# Patient Record
Sex: Female | Born: 1935
Health system: Southern US, Community
[De-identification: ages and names within clinical notes are randomized; demographics above are authoritative.]

## PROBLEM LIST (undated history)

## (undated) DIAGNOSIS — T4145XA Adverse effect of unspecified anesthetic, initial encounter: Secondary | ICD-10-CM

## (undated) DIAGNOSIS — I10 Essential (primary) hypertension: Secondary | ICD-10-CM

## (undated) DIAGNOSIS — K219 Gastro-esophageal reflux disease without esophagitis: Secondary | ICD-10-CM

## (undated) DIAGNOSIS — M1990 Unspecified osteoarthritis, unspecified site: Secondary | ICD-10-CM

## (undated) DIAGNOSIS — E079 Disorder of thyroid, unspecified: Secondary | ICD-10-CM

## (undated) HISTORY — PX: CARPECTOMY: SHX5004

## (undated) HISTORY — DX: Disorder of thyroid, unspecified: E07.9

## (undated) HISTORY — DX: Gastro-esophageal reflux disease without esophagitis: K21.9

---

## 1966-09-05 HISTORY — PX: APPENDECTOMY: SHX54

## 1979-05-07 HISTORY — PX: ABDOMINAL HYSTERECTOMY: SHX81

## 1980-09-05 HISTORY — PX: OTHER SURGICAL HISTORY: SHX169

## 1980-09-05 HISTORY — PX: BILROTH I PROCEDURE: SHX1231

## 1988-09-05 HISTORY — PX: HYSTEROTOMY: SHX1776

## 2006-08-09 ENCOUNTER — Encounter: Admission: RE | Admit: 2006-08-09 | Discharge: 2006-08-09 | Payer: Self-pay | Admitting: Internal Medicine

## 2007-11-01 ENCOUNTER — Ambulatory Visit (HOSPITAL_COMMUNITY): Admission: RE | Admit: 2007-11-01 | Discharge: 2007-11-01 | Payer: Self-pay | Admitting: *Deleted

## 2007-11-01 ENCOUNTER — Encounter (INDEPENDENT_AMBULATORY_CARE_PROVIDER_SITE_OTHER): Payer: Self-pay | Admitting: *Deleted

## 2008-01-10 ENCOUNTER — Ambulatory Visit (HOSPITAL_COMMUNITY): Admission: RE | Admit: 2008-01-10 | Discharge: 2008-01-10 | Payer: Self-pay | Admitting: *Deleted

## 2008-09-05 DIAGNOSIS — T8859XA Other complications of anesthesia, initial encounter: Secondary | ICD-10-CM

## 2008-09-05 HISTORY — DX: Other complications of anesthesia, initial encounter: T88.59XA

## 2008-09-05 HISTORY — PX: SPINE SURGERY: SHX786

## 2008-10-29 ENCOUNTER — Encounter: Admission: RE | Admit: 2008-10-29 | Discharge: 2008-10-29 | Payer: Self-pay | Admitting: Internal Medicine

## 2008-10-31 ENCOUNTER — Encounter: Admission: RE | Admit: 2008-10-31 | Discharge: 2008-10-31 | Payer: Self-pay | Admitting: Internal Medicine

## 2008-11-06 ENCOUNTER — Emergency Department (HOSPITAL_COMMUNITY): Admission: EM | Admit: 2008-11-06 | Discharge: 2008-11-06 | Payer: Self-pay | Admitting: Family Medicine

## 2008-11-10 ENCOUNTER — Encounter: Admission: RE | Admit: 2008-11-10 | Discharge: 2008-11-10 | Payer: Self-pay | Admitting: Internal Medicine

## 2008-11-13 ENCOUNTER — Ambulatory Visit: Payer: Self-pay | Admitting: Cardiology

## 2008-11-18 ENCOUNTER — Ambulatory Visit: Payer: Self-pay | Admitting: Pulmonary Disease

## 2008-11-18 ENCOUNTER — Inpatient Hospital Stay (HOSPITAL_COMMUNITY): Admission: RE | Admit: 2008-11-18 | Discharge: 2008-11-26 | Payer: Self-pay | Admitting: Neurosurgery

## 2008-11-19 ENCOUNTER — Encounter: Payer: Self-pay | Admitting: Critical Care Medicine

## 2009-09-05 HISTORY — PX: CARPAL TUNNEL RELEASE: SHX101

## 2010-04-23 ENCOUNTER — Encounter: Admission: RE | Admit: 2010-04-23 | Discharge: 2010-04-23 | Payer: Self-pay | Admitting: Internal Medicine

## 2010-09-20 LAB — COMPREHENSIVE METABOLIC PANEL
ALT: 13 U/L (ref 0–35)
AST: 21 U/L (ref 0–37)
Albumin: 3.9 g/dL (ref 3.5–5.2)
Alkaline Phosphatase: 73 U/L (ref 39–117)
BUN: 16 mg/dL (ref 6–23)
CO2: 31 mEq/L (ref 19–32)
Calcium: 9.5 mg/dL (ref 8.4–10.5)
Chloride: 103 mEq/L (ref 96–112)
Creatinine, Ser: 0.67 mg/dL (ref 0.4–1.2)
GFR calc Af Amer: >60 mL/min (ref >60)
GFR calc non Af Amer: >60 mL/min (ref >60)
Glucose, Bld: 116 mg/dL — ABNORMAL HIGH (ref 70–99)
Potassium: 4 mEq/L (ref 3.5–5.1)
Sodium: 141 mEq/L (ref 135–145)
Total Bilirubin: 0.7 mg/dL (ref 0.3–1.2)
Total Protein: 7.3 g/dL (ref 6.0–8.3)

## 2010-09-20 LAB — CBC
HCT: 44.3 % (ref 36.0–46.0)
Hemoglobin: 14.5 g/dL (ref 12.0–15.0)
MCH: 31.3 pg (ref 26.0–34.0)
MCHC: 32.7 g/dL (ref 30.0–36.0)
MCV: 95.7 fL (ref 78.0–100.0)
Platelets: 217 10*3/uL (ref 150–400)
RBC: 4.63 MIL/uL (ref 3.87–5.11)
RDW: 12.9 % (ref 11.5–15.5)
WBC: 6.3 10*3/uL (ref 4.0–10.5)

## 2010-09-20 LAB — SURGICAL PCR SCREEN
MRSA, PCR: NEGATIVE
Staphylococcus aureus: NEGATIVE

## 2010-09-22 ENCOUNTER — Inpatient Hospital Stay (HOSPITAL_COMMUNITY)
Admission: RE | Admit: 2010-09-22 | Discharge: 2010-09-24 | Payer: Self-pay | Source: Home / Self Care | Attending: Surgery | Admitting: Surgery

## 2010-09-27 LAB — CBC
HCT: 41.4 % (ref 36.0–46.0)
HCT: 38.4 % (ref 36.0–46.0)
Hemoglobin: 13.3 g/dL (ref 12.0–15.0)
Hemoglobin: 12.4 g/dL (ref 12.0–15.0)
MCH: 30.8 pg (ref 26.0–34.0)
MCH: 30.6 pg (ref 26.0–34.0)
MCHC: 32.1 g/dL (ref 30.0–36.0)
MCHC: 32.3 g/dL (ref 30.0–36.0)
MCV: 95.8 fL (ref 78.0–100.0)
MCV: 94.8 fL (ref 78.0–100.0)
Platelets: 170 10*3/uL (ref 150–400)
Platelets: 158 10*3/uL (ref 150–400)
RBC: 4.32 MIL/uL (ref 3.87–5.11)
RBC: 4.05 MIL/uL (ref 3.87–5.11)
RDW: 12.7 % (ref 11.5–15.5)
RDW: 13 % (ref 11.5–15.5)
WBC: 11.5 10*3/uL — ABNORMAL HIGH (ref 4.0–10.5)
WBC: 4.9 10*3/uL (ref 4.0–10.5)

## 2010-09-27 LAB — DIFFERENTIAL
Basophils Absolute: 0 10*3/uL (ref 0.0–0.1)
Basophils Absolute: 0 10*3/uL (ref 0.0–0.1)
Basophils Relative: 0 % (ref 0–1)
Basophils Relative: 0 % (ref 0–1)
Eosinophils Absolute: 0 10*3/uL (ref 0.0–0.7)
Eosinophils Absolute: 0.1 10*3/uL (ref 0.0–0.7)
Eosinophils Relative: 0 % (ref 0–5)
Eosinophils Relative: 2 % (ref 0–5)
Lymphocytes Relative: 7 % — ABNORMAL LOW (ref 12–46)
Lymphocytes Relative: 37 % (ref 12–46)
Lymphs Abs: 0.8 10*3/uL (ref 0.7–4.0)
Lymphs Abs: 1.8 10*3/uL (ref 0.7–4.0)
Monocytes Absolute: 0.4 10*3/uL (ref 0.1–1.0)
Monocytes Absolute: 0.5 10*3/uL (ref 0.1–1.0)
Monocytes Relative: 3 % (ref 3–12)
Monocytes Relative: 11 % (ref 3–12)
Neutro Abs: 10.3 10*3/uL — ABNORMAL HIGH (ref 1.7–7.7)
Neutro Abs: 2.5 10*3/uL (ref 1.7–7.7)
Neutrophils Relative %: 90 % — ABNORMAL HIGH (ref 43–77)
Neutrophils Relative %: 50 % (ref 43–77)

## 2010-09-27 LAB — BASIC METABOLIC PANEL
BUN: 13 mg/dL (ref 6–23)
CO2: 28 mEq/L (ref 19–32)
Calcium: 8.9 mg/dL (ref 8.4–10.5)
Chloride: 103 mEq/L (ref 96–112)
Creatinine, Ser: 0.74 mg/dL (ref 0.4–1.2)
GFR calc Af Amer: >60 mL/min (ref >60)
GFR calc non Af Amer: >60 mL/min (ref >60)
Glucose, Bld: 191 mg/dL — ABNORMAL HIGH (ref 70–99)
Potassium: 3.8 mEq/L (ref 3.5–5.1)
Sodium: 137 mEq/L (ref 135–145)

## 2010-09-27 LAB — TYPE AND SCREEN
ABO/RH(D): O POS
Antibody Screen: NEGATIVE

## 2010-09-27 LAB — ABO/RH: ABO/RH(D): O POS

## 2010-10-01 NOTE — Discharge Summary (Signed)
NAME:  Andrea Neal, Andrea Neal            ACCOUNT NO.:  [id number]  MEDICAL RECORD NO.:  [id number]          PATIENT TYPE:  INP  LOCATION:  1523                         FACILITY:  [facility]  PHYSICIAN:  Thornton Park. Daphine Deutscher, MD  DATE OF BIRTH:  Jan 31, 1934  DATE OF ADMISSION:  09/22/2010 DATE OF DISCHARGE:  09/24/2010                              DISCHARGE SUMMARY   ADMITTING DIAGNOSIS:  History of acid reflux and a large diverticulum of the distal esophagus.  DISCHARGE DIAGNOSIS:  A large diverticulum of the distal esophagus undissectable and reachable through a laparoscopic approach because of underlying inflammatory adhesions from previous gastrectomy and Billroth I anastomosis.  COURSE IN HOSPITAL:  Andrea Neal is a 77 year old white female who relocated to [place] from [place] a few years ago where she had undergone a partial gastrectomy and Billroth I back in the early 80s at [facility] in [place], [place] which has now subsequently closed.  Our best oral history is that she underwent an open truncal vagotomy with antrectomy and Billroth I anastomosis.  It is not clear what they did near the GE junction.  PROCEDURE:  September 22, 2010, which was laparoscopy with extensive enterolysis (x2 hours involving adhesions in the lower and upper midline and in the upper abdomen and foregut) with upper esophagogastroenteroscopy.  FINDINGS:  A large esophageal diverticulum visualized about 5 cm from the esophagogastric junction that is largely broad mouthed and is probably chronic in nature.  COURSE IN THE HOSPITAL:  [facility] underwent the above-mentioned operation on January 18.  On postoperative day #1 she was checked and was doing fine.  Vital signs were stable.  She was having absolutely no pain.  Her Foley was discontinued.  We talked about discussing this diverticulum with a thoracic surgeon to consider whether a right thoracotomy would be advisable.   The risks and benefits would need to be explored since she is having minimal symptoms from this.  In the near- term, I think if she were to lie on her left side down that this could promote some drainage from this diverticulum and evacuation since it tends to retain vegetated material that sometimes comes up at odd times.  Postoperative day #2 she continued to look very well, had stable vital signs and was essentially having no pain.  Therefore, no pain medicines were given.  She was just advised to use some Tylenol.  She will follow up in the office to see me in about 3 weeks and we will discuss whether any further surgery is necessary.  It may be this could be managed with observation, again, because these symptoms, I think, can be managed with some medications as well as diet and changes in positioning after eating.  CONDITION ON DISCHARGE:  Good, stable.  PLAN:  Return in 3 weeks to the office.     Thornton Park Daphine Deutscher, MD     MBM/MEDQ  D:  09/24/2010  T:  09/24/2010  Job:  [id number]  cc:   Jordan Hawks. Elnoria Howard, MD Fax: [phone]  Georgianne Fick, M.D. Fax: [phone]  Electronically Signed by Luretha Murphy MD on 10/01/2010 08:30:36 PM

## 2010-12-16 LAB — BASIC METABOLIC PANEL
BUN: 13 mg/dL (ref 6–23)
BUN: 16 mg/dL (ref 6–23)
BUN: 9 mg/dL (ref 6–23)
CO2: 25 mEq/L (ref 19–32)
CO2: 29 mEq/L (ref 19–32)
Calcium: 8.2 mg/dL — ABNORMAL LOW (ref 8.4–10.5)
Calcium: 9.3 mg/dL (ref 8.4–10.5)
Chloride: 103 mEq/L (ref 96–112)
Chloride: 104 mEq/L (ref 96–112)
Chloride: 105 mEq/L (ref 96–112)
Creatinine, Ser: 0.6 mg/dL (ref 0.4–1.2)
Creatinine, Ser: 0.61 mg/dL (ref 0.4–1.2)
Creatinine, Ser: 0.68 mg/dL (ref 0.4–1.2)
GFR calc Af Amer: >60 mL/min (ref >60)
GFR calc Af Amer: >60 mL/min (ref >60)
GFR calc non Af Amer: >60 mL/min (ref >60)
GFR calc non Af Amer: >60 mL/min (ref >60)
Glucose, Bld: 100 mg/dL — ABNORMAL HIGH (ref 70–99)
Glucose, Bld: 131 mg/dL — ABNORMAL HIGH (ref 70–99)
Glucose, Bld: 153 mg/dL — ABNORMAL HIGH (ref 70–99)
Potassium: 3.2 mEq/L — ABNORMAL LOW (ref 3.5–5.1)
Potassium: 3.4 mEq/L — ABNORMAL LOW (ref 3.5–5.1)
Potassium: 3.7 mEq/L (ref 3.5–5.1)
Sodium: 135 mEq/L (ref 135–145)
Sodium: 139 mEq/L (ref 135–145)

## 2010-12-16 LAB — CBC
HCT: 24.7 % — ABNORMAL LOW (ref 36.0–46.0)
HCT: 32.8 % — ABNORMAL LOW (ref 36.0–46.0)
HCT: 33 % — ABNORMAL LOW (ref 36.0–46.0)
HCT: 42.7 % (ref 36.0–46.0)
Hemoglobin: 11.3 g/dL — ABNORMAL LOW (ref 12.0–15.0)
Hemoglobin: 11.4 g/dL — ABNORMAL LOW (ref 12.0–15.0)
Hemoglobin: 14.5 g/dL (ref 12.0–15.0)
MCHC: 34 g/dL (ref 30.0–36.0)
MCHC: 34.2 g/dL (ref 30.0–36.0)
MCV: 92.1 fL (ref 78.0–100.0)
MCV: 93 fL (ref 78.0–100.0)
MCV: 93.2 fL (ref 78.0–100.0)
Platelets: 131 10*3/uL — ABNORMAL LOW (ref 150–400)
Platelets: 204 10*3/uL (ref 150–400)
Platelets: 263 10*3/uL (ref 150–400)
RBC: 3.55 MIL/uL — ABNORMAL LOW (ref 3.87–5.11)
RBC: 4.59 MIL/uL (ref 3.87–5.11)
RDW: 12.8 % (ref 11.5–15.5)
RDW: 12.9 % (ref 11.5–15.5)
RDW: 12.9 % (ref 11.5–15.5)
RDW: 13.2 % (ref 11.5–15.5)
WBC: 13.7 10*3/uL — ABNORMAL HIGH (ref 4.0–10.5)
WBC: 6.6 10*3/uL (ref 4.0–10.5)
WBC: 6.6 10*3/uL (ref 4.0–10.5)
WBC: 8.8 10*3/uL (ref 4.0–10.5)

## 2010-12-16 LAB — CK TOTAL AND CKMB (NOT AT ARMC)
CK, MB: 59 ng/mL — ABNORMAL HIGH (ref 0.3–4.0)
Relative Index: 4.4 — ABNORMAL HIGH (ref 0.0–2.5)
Total CK: 1334 U/L — ABNORMAL HIGH (ref 7–177)

## 2010-12-16 LAB — COMPREHENSIVE METABOLIC PANEL
AST: 57 U/L — ABNORMAL HIGH (ref 0–37)
Albumin: 2.6 g/dL — ABNORMAL LOW (ref 3.5–5.2)
BUN: 14 mg/dL (ref 6–23)
Calcium: 8.3 mg/dL — ABNORMAL LOW (ref 8.4–10.5)
Creatinine, Ser: 0.7 mg/dL (ref 0.4–1.2)
GFR calc Af Amer: >60 mL/min (ref >60)
Total Protein: 4.5 g/dL — ABNORMAL LOW (ref 6.0–8.3)

## 2010-12-16 LAB — TSH: TSH: 0.494 u[IU]/mL (ref 0.350–4.500)

## 2010-12-16 LAB — LACTIC ACID, PLASMA: Lactic Acid, Venous: 2.5 mmol/L — ABNORMAL HIGH (ref 0.5–2.2)

## 2010-12-16 LAB — CARDIAC PANEL(CRET KIN+CKTOT+MB+TROPI)
CK, MB: 34.7 ng/mL — ABNORMAL HIGH (ref 0.3–4.0)
CK, MB: 72.2 ng/mL — ABNORMAL HIGH (ref 0.3–4.0)
Relative Index: 3.3 — ABNORMAL HIGH (ref 0.0–2.5)
Relative Index: 3.5 — ABNORMAL HIGH (ref 0.0–2.5)
Total CK: 2048 U/L — ABNORMAL HIGH (ref 7–177)
Troponin I: <0.01 ng/mL (ref 0.00–0.06)
Troponin I: <0.01 ng/mL (ref 0.00–0.06)

## 2010-12-16 LAB — APTT: aPTT: 30 seconds (ref 24–37)

## 2010-12-16 LAB — TROPONIN I: Troponin I: <0.01 ng/mL (ref 0.00–0.06)

## 2010-12-16 LAB — BRAIN NATRIURETIC PEPTIDE: Pro B Natriuretic peptide (BNP): 211 pg/mL — ABNORMAL HIGH (ref 0.0–100.0)

## 2010-12-16 LAB — PROTIME-INR
INR: 1.1 (ref 0.00–1.49)
Prothrombin Time: 14 seconds (ref 11.6–15.2)

## 2010-12-16 LAB — TYPE AND SCREEN: ABO/RH(D): O POS

## 2010-12-16 LAB — PHOSPHORUS: Phosphorus: 4.5 mg/dL (ref 2.3–4.6)

## 2011-01-18 NOTE — Op Note (Signed)
NAMEMarland Kitchen  Andrea Neal, Andrea Neal            ACCOUNT NO.:  [id number]   MEDICAL RECORD NO.:  [id number]          PATIENT TYPE:  INP   LOCATION:  3113                         FACILITY:  [facility]   PHYSICIAN:  Hilda Lias, M.D.   DATE OF BIRTH:  1933-12-16   DATE OF PROCEDURE:  11/18/2008  DATE OF DISCHARGE:                               OPERATIVE REPORT   PREOPERATIVE DIAGNOSES:  L3-4 lumbar stenosis, L4-5 spondylolisthesis  with stenosis, and chronic radiculopathy.   POSTOPERATIVE DIAGNOSES:  L3-4 lumbar stenosis, L4-5 spondylolisthesis  with stenosis, and chronic radiculopathy.   PROCEDURE:  Bilateral L3-4 laminectomy, bilateral L4-5 diskectomy,  bilateral facetectomy to remove the disk, cages 10 x 22 with BMP and  autograft and pedicle screws at L4-5, posterolateral arthrodesis at L3-  L5, Cell Saver C-arm.   SURGEON:  Hilda Lias, MD   ASSISTANT:  Hewitt Shorts, MD.   CLINICAL HISTORY:  [name] is a 77 year old female complaining of  back with radiation to both legs.  X-rays showed that she has stenosis  at L3-4 plus spondylolisthesis at L4-5.  Surgery was advised in view of  failure with conservative treatment.   PROCEDURE:  The patient was taken to the OR and after intubation she was  positioned in prone manner.  The skin was cleaned with DuraPrep.  Then,  a midline incision was made and muscle was retracted all the way  laterally until we were able to see the transverse process.  X-rays  showed that indeed we were at the level of L4-5.  From then on, we did a  laminectomy of L3 to decompress the thecal sac at the level of L4 with  laminectomy and we had to do facetectomy to be able to get into the disk  space.  Then, we entered the L4-5 disk space and total gross diskectomy  was done.  The disk was replaced using 2 cages of 10 x 22 with autograft  and BMP inside.  Laterally in the disk space, we used autograft to fill  out the space.  Having good decompression and  plenty of space for the L4  and L5 nerve root and using the C-arm first in AP view and later on the  lateral view, we brought the pedicle of L4-5 and we introduced 4 screws  of 5.5 x 15.  Prior to introducing the screws, we filled the hole just  to the sure that it was surrounded by bone.  The pedicle was connected  with a rod and caps.  Then, we went laterally and we removed the  periosteum of the lateral facet of L3-4 and L4-5 as well as the  transverse process.  Then, a mix of BMP and autograft was used for  arthrodesis.  Valsalva maneuver was negative.  From then on, the area  was irrigated and closed with Vicryl and Steri-Strips.           ______________________________  Hilda Lias, M.D.     EB/MEDQ  D:  11/18/2008  T:  11/19/2008  Job:  [id number]

## 2011-01-18 NOTE — Op Note (Signed)
NAME:  Andrea Neal, Andrea Neal            ACCOUNT NO.:  [id number]   MEDICAL RECORD NO.:  [id number]          PATIENT TYPE:  AMB   LOCATION:  ENDO                         FACILITY:  [facility]   PHYSICIAN:  Georgiana Spinner, M.D.    DATE OF BIRTH:  May 10, 1934   DATE OF PROCEDURE:  01/10/2008  DATE OF DISCHARGE:                               OPERATIVE REPORT   PROCEDURE:  Colonoscopy.   INDICATIONS:  Colon cancer screening.   ANESTHESIA:  Demerol 70 mg, Versed 7 mg.   PROCEDURE:  With the patient mildly sedated in the left lateral  decubitus position, the Pentax videoscopic colonoscope was inserted in  the rectum, passed under direct vision to the cecum after placing  patient on her back and applying abdominal pressure.  The cecum was  identified by ileocecal valve and base of cecum, both of which were  photographed.  From this point, the colonoscope was slowly withdrawn  taking circumferential views of colonic mucosa stopping only down in the  rectum which appeared normal on direct and showed hemorrhoids on  retroflexed view.  The endoscope was straightened and withdrawn.  The  patient's vital signs and pulse oximeter remained stable.  The patient  tolerated procedure well without apparent complication.   FINDINGS:  Mild diverticulosis of sigmoid colon.  Internal hemorrhoids,  otherwise an unremarkable exam.   PLAN:  Consider repeat examination in 5-10 years.           ______________________________  Georgiana Spinner, M.D.     GMO/MEDQ  D:  01/10/2008  T:  01/10/2008  Job:  [id number]

## 2011-01-18 NOTE — Op Note (Signed)
NAME:  Andrea Neal, Andrea Neal            ACCOUNT NO.:  [id number]   MEDICAL RECORD NO.:  [id number]          PATIENT TYPE:  AMB   LOCATION:  ENDO                         [facility]ACILITY[facility]   PHYSICIAN:  Georgiana Spinner, M.D.    DATE OF BIRTH:  18-Feb-1934   DATE OF PROCEDURE:  11/01/2007  DATE OF DISCHARGE:                               OPERATIVE REPORT   PROCEDURE:  Upper endoscopy with biopsy.   INDICATIONS:  Vomiting, gastroesophageal reflux disease, rule out  gastroparesis.   ANESTHESIA:  Fentanyl 50 mcg and Versed 5 mg.   PROCEDURE IN DETAIL:  With the patient mildly sedated in the left  lateral decubitus position, the Pentax videoscopic endoscope was  inserted in the mouth and passed under direct vision through the  esophagus which appeared normal but there was food in the esophagus that  was undigested. We were able to advance past this into the stomach and  there was food seen in the fundus of the stomach consistent with  gastroparesis. The antrum appeared, in areas, erythematous, this was  photographed and biopsied.  Duodenal bulb and second portion of the  duodenum were visualized and biopsies were taken of an erythematous area  in the duodenum, as well.  The endoscope was then slowly withdrawn  taking circumferential views of the duodenal mucosa until the endoscope  had been pulled back into the stomach and placed in retroflexion to view  the stomach from below. The endoscope was then straightened and  withdrawn taking circumferential views of the remaining gastric and  esophageal mucosa.  The patient's vital signs and pulse oximeter  remained stable.  The patient tolerated the procedure well without  apparent complication.   FINDINGS:  Changes of gastroparesis, erythema of the antrum and duodenal  bulb, await biopsy reports.  The patient will call me for results. Will  start the patient on metoclopramide 10 mg before meals and at bedtime  because the patient is having nocturnal  symptoms, as well.  Will have  the patient follow-up with me as an outpatient.           ______________________________  Georgiana Spinner, M.D.     GMO/MEDQ  D:  11/01/2007  T:  11/01/2007  Job:  [id number]

## 2011-01-18 NOTE — Discharge Summary (Signed)
[name], Andrea Neal            ACCOUNT NO.:  [id number]   MEDICAL RECORD NO.:  [id number]          PATIENT TYPE:  INP   LOCATION:  3038                         FACILITY:  [facility]   PHYSICIAN:  Hilda Lias, M.D.   DATE OF BIRTH:  Jul 12, 1934   DATE OF ADMISSION:  11/18/2008  DATE OF DISCHARGE:  11/26/2008                               DISCHARGE SUMMARY   ADMISSION DIAGNOSES:  1. L3-L4 stenosis.  2. L4-5 spondylolisthesis.   FINAL DIAGNOSES:  1. L3-L4 stenosis.  2. L4-5 spondylolisthesis.   CLINICAL HISTORY:  Ms. Hay is a 77 year old female, complaining of  back pain with radiation to both legs.  X-ray shows stenosis at L3-4  with spondylolisthesis at L4-5.  The patient has failed with  conservative treatment.  Surgery was advised.  Laboratory normal.   COURSE IN THE HOSPITAL:  The patient was taken to surgery on November 18, 2008, and L3-L4 laminectomy with fusion of the L4-5 was done.  The  patient had quite a bit of intense postoperative pain.  Also immediately  after surgery, she was quite hypotensive with a normal CBC.  She was  transferred from the PACU to the intensive care unit, and she was seen  by the Critical Medicine.  Complete workup was negative.  She was given  dopamine to bring the blood pressure up.  Eventually, the blood pressure  came back to normal.  The patient continued to ambulate.  For the past 2  days, she had been complaining of quite a bit of intensive pain.  We  have readjusted her pain medication, and this morning, she is feeling  much better.  She tells me that the pain has almost completely gone in  the past 24 hours, and she wants to go home.   CONDITION ON DISCHARGE:  Improving.   MEDICATIONS:  Percocet and diazepam, and she will continue her previous  medications.   She also was advised to be seen by her medical doctor in relation to her  blood pressure.   DIET:  Regular.   ACTIVITY:  Not to drive, not to bend.   FOLLOWUP:  She  is going to be seen by me in my office in 4 weeks.  If  any questions, she is going to see me anytime.          ______________________________  Hilda Lias, M.D.    EB/MEDQ  D:  11/26/2008  T:  11/26/2008  Job:  [id number]

## 2011-10-18 DIAGNOSIS — R5381 Other malaise: Secondary | ICD-10-CM | POA: Diagnosis not present

## 2011-10-18 DIAGNOSIS — R5383 Other fatigue: Secondary | ICD-10-CM | POA: Diagnosis not present

## 2011-10-18 DIAGNOSIS — N390 Urinary tract infection, site not specified: Secondary | ICD-10-CM | POA: Diagnosis not present

## 2011-10-18 DIAGNOSIS — R079 Chest pain, unspecified: Secondary | ICD-10-CM | POA: Diagnosis not present

## 2011-10-25 DIAGNOSIS — I10 Essential (primary) hypertension: Secondary | ICD-10-CM | POA: Diagnosis not present

## 2011-10-25 DIAGNOSIS — E039 Hypothyroidism, unspecified: Secondary | ICD-10-CM | POA: Diagnosis not present

## 2011-10-25 DIAGNOSIS — M159 Polyosteoarthritis, unspecified: Secondary | ICD-10-CM | POA: Diagnosis not present

## 2011-11-01 DIAGNOSIS — I10 Essential (primary) hypertension: Secondary | ICD-10-CM | POA: Diagnosis not present

## 2011-11-01 DIAGNOSIS — Z1212 Encounter for screening for malignant neoplasm of rectum: Secondary | ICD-10-CM | POA: Diagnosis not present

## 2011-11-01 DIAGNOSIS — E039 Hypothyroidism, unspecified: Secondary | ICD-10-CM | POA: Diagnosis not present

## 2011-11-01 DIAGNOSIS — H908 Mixed conductive and sensorineural hearing loss, unspecified: Secondary | ICD-10-CM | POA: Diagnosis not present

## 2012-04-24 DIAGNOSIS — I10 Essential (primary) hypertension: Secondary | ICD-10-CM | POA: Diagnosis not present

## 2012-04-24 DIAGNOSIS — E039 Hypothyroidism, unspecified: Secondary | ICD-10-CM | POA: Diagnosis not present

## 2012-04-24 DIAGNOSIS — M159 Polyosteoarthritis, unspecified: Secondary | ICD-10-CM | POA: Diagnosis not present

## 2012-04-30 DIAGNOSIS — E039 Hypothyroidism, unspecified: Secondary | ICD-10-CM | POA: Diagnosis not present

## 2012-04-30 DIAGNOSIS — M159 Polyosteoarthritis, unspecified: Secondary | ICD-10-CM | POA: Diagnosis not present

## 2012-04-30 DIAGNOSIS — I10 Essential (primary) hypertension: Secondary | ICD-10-CM | POA: Diagnosis not present

## 2012-06-29 DIAGNOSIS — Z23 Encounter for immunization: Secondary | ICD-10-CM | POA: Diagnosis not present

## 2012-10-22 DIAGNOSIS — E039 Hypothyroidism, unspecified: Secondary | ICD-10-CM | POA: Diagnosis not present

## 2012-10-22 DIAGNOSIS — I10 Essential (primary) hypertension: Secondary | ICD-10-CM | POA: Diagnosis not present

## 2012-10-22 DIAGNOSIS — M159 Polyosteoarthritis, unspecified: Secondary | ICD-10-CM | POA: Diagnosis not present

## 2012-10-29 DIAGNOSIS — M159 Polyosteoarthritis, unspecified: Secondary | ICD-10-CM | POA: Diagnosis not present

## 2012-10-29 DIAGNOSIS — R7301 Impaired fasting glucose: Secondary | ICD-10-CM | POA: Diagnosis not present

## 2012-10-29 DIAGNOSIS — M949 Disorder of cartilage, unspecified: Secondary | ICD-10-CM | POA: Diagnosis not present

## 2012-10-29 DIAGNOSIS — I10 Essential (primary) hypertension: Secondary | ICD-10-CM | POA: Diagnosis not present

## 2012-10-29 DIAGNOSIS — H908 Mixed conductive and sensorineural hearing loss, unspecified: Secondary | ICD-10-CM | POA: Diagnosis not present

## 2012-10-29 DIAGNOSIS — R5381 Other malaise: Secondary | ICD-10-CM | POA: Diagnosis not present

## 2012-10-29 DIAGNOSIS — E039 Hypothyroidism, unspecified: Secondary | ICD-10-CM | POA: Diagnosis not present

## 2012-10-29 DIAGNOSIS — Z23 Encounter for immunization: Secondary | ICD-10-CM | POA: Diagnosis not present

## 2013-02-20 DIAGNOSIS — H52229 Regular astigmatism, unspecified eye: Secondary | ICD-10-CM | POA: Diagnosis not present

## 2013-02-20 DIAGNOSIS — H2510 Age-related nuclear cataract, unspecified eye: Secondary | ICD-10-CM | POA: Diagnosis not present

## 2013-02-20 DIAGNOSIS — H5200 Hypermetropia, unspecified eye: Secondary | ICD-10-CM | POA: Diagnosis not present

## 2013-02-20 DIAGNOSIS — H25019 Cortical age-related cataract, unspecified eye: Secondary | ICD-10-CM | POA: Diagnosis not present

## 2013-06-21 DIAGNOSIS — Z23 Encounter for immunization: Secondary | ICD-10-CM | POA: Diagnosis not present

## 2013-08-12 DIAGNOSIS — R079 Chest pain, unspecified: Secondary | ICD-10-CM | POA: Diagnosis not present

## 2013-08-12 DIAGNOSIS — E039 Hypothyroidism, unspecified: Secondary | ICD-10-CM | POA: Diagnosis not present

## 2013-08-19 DIAGNOSIS — R0789 Other chest pain: Secondary | ICD-10-CM | POA: Diagnosis not present

## 2013-08-22 DIAGNOSIS — I10 Essential (primary) hypertension: Secondary | ICD-10-CM | POA: Diagnosis not present

## 2013-08-22 DIAGNOSIS — R7301 Impaired fasting glucose: Secondary | ICD-10-CM | POA: Diagnosis not present

## 2013-08-22 DIAGNOSIS — R5381 Other malaise: Secondary | ICD-10-CM | POA: Diagnosis not present

## 2013-08-22 DIAGNOSIS — E039 Hypothyroidism, unspecified: Secondary | ICD-10-CM | POA: Diagnosis not present

## 2013-08-22 DIAGNOSIS — M159 Polyosteoarthritis, unspecified: Secondary | ICD-10-CM | POA: Diagnosis not present

## 2013-08-23 DIAGNOSIS — K599 Functional intestinal disorder, unspecified: Secondary | ICD-10-CM | POA: Diagnosis not present

## 2013-09-11 DIAGNOSIS — R7301 Impaired fasting glucose: Secondary | ICD-10-CM | POA: Diagnosis not present

## 2013-09-11 DIAGNOSIS — A071 Giardiasis [lambliasis]: Secondary | ICD-10-CM | POA: Diagnosis not present

## 2013-09-11 DIAGNOSIS — K2100 Gastro-esophageal reflux disease with esophagitis, without bleeding: Secondary | ICD-10-CM | POA: Diagnosis not present

## 2013-09-11 DIAGNOSIS — R1013 Epigastric pain: Secondary | ICD-10-CM | POA: Diagnosis not present

## 2013-11-18 DIAGNOSIS — E039 Hypothyroidism, unspecified: Secondary | ICD-10-CM | POA: Diagnosis not present

## 2013-11-18 DIAGNOSIS — R7301 Impaired fasting glucose: Secondary | ICD-10-CM | POA: Diagnosis not present

## 2013-11-18 DIAGNOSIS — R5383 Other fatigue: Secondary | ICD-10-CM | POA: Diagnosis not present

## 2013-11-18 DIAGNOSIS — I10 Essential (primary) hypertension: Secondary | ICD-10-CM | POA: Diagnosis not present

## 2013-11-18 DIAGNOSIS — Z0000 Encounter for general adult medical examination without abnormal findings: Secondary | ICD-10-CM | POA: Diagnosis not present

## 2013-11-18 DIAGNOSIS — R5381 Other malaise: Secondary | ICD-10-CM | POA: Diagnosis not present

## 2013-11-25 DIAGNOSIS — I10 Essential (primary) hypertension: Secondary | ICD-10-CM | POA: Diagnosis not present

## 2013-11-25 DIAGNOSIS — M159 Polyosteoarthritis, unspecified: Secondary | ICD-10-CM | POA: Diagnosis not present

## 2013-11-25 DIAGNOSIS — E039 Hypothyroidism, unspecified: Secondary | ICD-10-CM | POA: Diagnosis not present

## 2013-11-25 DIAGNOSIS — K2100 Gastro-esophageal reflux disease with esophagitis, without bleeding: Secondary | ICD-10-CM | POA: Diagnosis not present

## 2013-11-25 DIAGNOSIS — M25569 Pain in unspecified knee: Secondary | ICD-10-CM | POA: Diagnosis not present

## 2013-11-25 DIAGNOSIS — IMO0002 Reserved for concepts with insufficient information to code with codable children: Secondary | ICD-10-CM | POA: Diagnosis not present

## 2013-11-25 DIAGNOSIS — M1710 Unilateral primary osteoarthritis, unspecified knee: Secondary | ICD-10-CM | POA: Diagnosis not present

## 2013-11-25 DIAGNOSIS — R7301 Impaired fasting glucose: Secondary | ICD-10-CM | POA: Diagnosis not present

## 2013-11-26 DIAGNOSIS — R5382 Chronic fatigue, unspecified: Secondary | ICD-10-CM | POA: Diagnosis not present

## 2013-11-26 DIAGNOSIS — G9332 Myalgic encephalomyelitis/chronic fatigue syndrome: Secondary | ICD-10-CM | POA: Diagnosis not present

## 2013-11-27 DIAGNOSIS — M25569 Pain in unspecified knee: Secondary | ICD-10-CM | POA: Diagnosis not present

## 2013-11-27 DIAGNOSIS — M1990 Unspecified osteoarthritis, unspecified site: Secondary | ICD-10-CM | POA: Diagnosis not present

## 2013-12-04 DIAGNOSIS — R079 Chest pain, unspecified: Secondary | ICD-10-CM | POA: Diagnosis not present

## 2013-12-04 DIAGNOSIS — K219 Gastro-esophageal reflux disease without esophagitis: Secondary | ICD-10-CM | POA: Diagnosis not present

## 2013-12-04 DIAGNOSIS — Q398 Other congenital malformations of esophagus: Secondary | ICD-10-CM | POA: Diagnosis not present

## 2014-01-23 ENCOUNTER — Encounter (INDEPENDENT_AMBULATORY_CARE_PROVIDER_SITE_OTHER): Payer: Self-pay

## 2014-01-23 ENCOUNTER — Ambulatory Visit (INDEPENDENT_AMBULATORY_CARE_PROVIDER_SITE_OTHER): Payer: Medicare Other | Admitting: Surgery

## 2014-01-23 ENCOUNTER — Other Ambulatory Visit (INDEPENDENT_AMBULATORY_CARE_PROVIDER_SITE_OTHER): Payer: Self-pay

## 2014-01-23 ENCOUNTER — Encounter (INDEPENDENT_AMBULATORY_CARE_PROVIDER_SITE_OTHER): Payer: Self-pay | Admitting: Surgery

## 2014-01-23 VITALS — BP 136/80 | HR 75 | Temp 97.5°F | Ht 65.0 in | Wt 172.0 lb

## 2014-01-23 DIAGNOSIS — Q396 Congenital diverticulum of esophagus: Secondary | ICD-10-CM | POA: Insufficient documentation

## 2014-01-23 DIAGNOSIS — R1310 Dysphagia, unspecified: Secondary | ICD-10-CM

## 2014-01-23 DIAGNOSIS — K225 Diverticulum of esophagus, acquired: Secondary | ICD-10-CM

## 2014-01-23 NOTE — Progress Notes (Addendum)
Re:   Andrea Neal DOB:   [birth year] MRN:   [id number]  ASSESSMENT AND PLAN: 1.  Distal esophageal diverticula.  Approx 5 - 6 cm above GE junction.  From Dr. Ermalene Searing notes, it appears this will have to be approached from the chest and not through the abdomen.  Also, as best we can work up, we need to make sure she does not have some distal esophageal disease, which has lead to increased esophageal pressure and thus this diverticula.  I reviewed the prior findings with the patient and will proceed with the following plan.  Plan:  1) Update barium swallow, 2) I talked to Dr. Elnoria Howard - who will arrange upper endo and manometry for the patient, 3) After the previous information is obtained, she will see Dr. Tyrone Sage.  I spoke to him briefly on the phone today and outlined the prior findings.  2.  History of prior Bilroth I for ulcer disease (1982) 3.  Hypothyroid 4.  GERD 5.  Hypothyroid, corrected.  Chief Complaint  Patient presents with  . eval dysphagia   REFERRING PHYSICIAN: RAMACHANDRAN,AJITH, MD  HISTORY OF PRESENT ILLNESS: Andrea Neal is a 80 y.o. (DOB: 21-Sep-1933)  white  female whose primary care physician is [facility], MD and comes to me today for esophageal diverticula. She comes by herself. The patient has been followed by Dr. Chip Boer.    The patient complains of choking on any type of food. She feels at times like she has a tennis ball in her esophagus. She will spit up undigested food that she ate several days earlier.  She has "bad reflux".  She will wake up a 2 AM coughing, then she has some wheezing which will clear after a few hours. She had an attempted surgery by Dr. Daphine Deutscher in 2012, but her symptoms have only gotten worse since then.  She has a history of a Billroth I in 1982. She had an upper endo by Dr. Elnoria Howard 06/16/2011.  He was not able to visualize the diverticula because of food material, but he was able to get into the stomach.  An UGI -  04/23/2010 - showed  moderate stricture of the distal esophagus.  Very large diverticulum of the distal esophagus projecting to the right of midline. This is located above the distal stricture. Prior gastric surgery without evidence of ulcer or gastric mass.    She had an laparoscopic enterolysis by Dr. Daphine Deutscher 09/16/2010.  It appears that he was trying to get to the esophagus, but could not because of adhesions.  Dr. Daphine Deutscher did note on upper endoscopy at that time a broad based esophageal diverticula with old vegetation material in it.  The diverticula was about 5 or 6 cm above the GE junction.  Her symptoms have worsened since that surgery.  She had asked not to see Dr. Daphine Deutscher back.  He other abdominal surgery include a hysterectomy in [place], [place], in 1990.   No past medical history on file.   No past surgical history on file.    No current outpatient prescriptions on file.   No current facility-administered medications for this visit.      Allergies  Allergen Reactions  . Aspirin   . Codeine   . Penicillins   . Sulfur   . Ultram [Tramadol]   . Vicodin [Hydrocodone-Acetaminophen]   . Vioxx [Rofecoxib]     REVIEW OF SYSTEMS: Skin:  No history of rash.  No history of abnormal moles. Infection:  No  history of hepatitis or HIV.  No history of MRSA. Neurologic:  No history of stroke.  No history of seizure.  No history of headaches. Cardiac:  Was on antihypertensives, but off meds now. No history of heart disease.  No history of prior cardiac catheterization.  No history of seeing a cardiologist. Pulmonary:  Does not smoke cigarettes.  No asthma or bronchitis.  No OSA/CPAP.  Endocrine:  No diabetes. Hypothyroid. Gastrointestinal:  See HPI Urologic:  No history of kidney stones.  No history of bladder infections. Musculoskeletal:  L4-L5 fusion - 11/2008. Hematologic:  No bleeding disorder.  No history of anemia.  Not anticoagulated. Psycho-social:  The patient is oriented.   The  patient has no obvious psychologic or social impairment to understanding our conversation and plan.  SOCIAL and FAMILY HISTORY: Widowed. But has boyfriend - Brinson Frix. Has 2 sons Arlys John- Brian, 78 yo, in [place], and [place], 78 [place].  PHYSICAL EXAM: BP 136/80  Pulse 75  Temp(Src) 97.5 F (36.4 C)  Ht 5\' 5"  (1.651 m)  Wt 172 lb (78.019 kg)  BMI 28.62 kg/m2  General: WN WF who is alert and generally healthy appearing.  She look younger than her stated age. HEENT: Normal. Pupils equal. Neck: Supple. No mass.  No thyroid mass. Lymph Nodes:  No supraclavicular or cervical nodes. Lungs: Clear to auscultation and symmetric breath sounds. Heart:  RRR. No murmur or rub.  Abdomen: Soft. No mass. No tenderness. No hernia. Normal bowel sounds.  She has an old upper midline scar and a lower midline scar. Rectal: Not done. Extremities:  Good strength and ROM  in upper and lower extremities. Neurologic:  Grossly intact to motor and sensory function. Psychiatric: Has normal mood and affect. Behavior is normal.   DATA REVIEWED: Epic notes.  Ovidio Kinavid Lonna Rabold, MD,  [facility] [address]  [address]   [place], [place]    [id number] Phone:  [phone] FAX:  [phone]

## 2014-01-28 ENCOUNTER — Ambulatory Visit (HOSPITAL_COMMUNITY)
Admission: RE | Admit: 2014-01-28 | Discharge: 2014-01-28 | Disposition: A | Discharge status: Home / Self Care | Payer: Medicare Other | Source: Ambulatory Visit | Attending: Surgery | Admitting: Surgery

## 2014-01-28 DIAGNOSIS — K228 Other specified diseases of esophagus: Secondary | ICD-10-CM | POA: Insufficient documentation

## 2014-01-28 DIAGNOSIS — Q398 Other congenital malformations of esophagus: Secondary | ICD-10-CM | POA: Diagnosis not present

## 2014-01-28 DIAGNOSIS — R1310 Dysphagia, unspecified: Secondary | ICD-10-CM | POA: Diagnosis not present

## 2014-01-28 DIAGNOSIS — R933 Abnormal findings on diagnostic imaging of other parts of digestive tract: Secondary | ICD-10-CM | POA: Diagnosis not present

## 2014-01-28 DIAGNOSIS — K219 Gastro-esophageal reflux disease without esophagitis: Secondary | ICD-10-CM | POA: Diagnosis not present

## 2014-01-28 DIAGNOSIS — M349 Systemic sclerosis, unspecified: Secondary | ICD-10-CM | POA: Diagnosis not present

## 2014-01-28 DIAGNOSIS — K2289 Other specified disease of esophagus: Secondary | ICD-10-CM | POA: Insufficient documentation

## 2014-01-29 ENCOUNTER — Encounter (HOSPITAL_COMMUNITY): Payer: Self-pay | Admitting: Pharmacy Technician

## 2014-01-31 ENCOUNTER — Encounter (HOSPITAL_COMMUNITY): Payer: Self-pay | Admitting: *Deleted

## 2014-02-03 ENCOUNTER — Telehealth (INDEPENDENT_AMBULATORY_CARE_PROVIDER_SITE_OTHER): Payer: Self-pay | Admitting: General Surgery

## 2014-02-03 ENCOUNTER — Telehealth (INDEPENDENT_AMBULATORY_CARE_PROVIDER_SITE_OTHER): Payer: Self-pay

## 2014-02-03 NOTE — Progress Notes (Signed)
11/25/13-EKG from Dr. Esaw Grandchild office on chart.

## 2014-02-03 NOTE — Telephone Encounter (Signed)
Patient aware of appointment with Tyrone Sage 02-10-14 2pm provided # [phone] if futher questions concerns

## 2014-02-03 NOTE — Telephone Encounter (Signed)
Cindy from Dr. Dennie Maizes office called to let us know that we can inform the patient that they have scheduled her an appt on 02/10/14 at 2:00 w/ an arrival time of 1:45. Informed her that I would let Glenda, Dr. Allene Pyo nurse aware of this.

## 2014-02-07 ENCOUNTER — Ambulatory Visit (HOSPITAL_COMMUNITY): Payer: Medicare Other | Admitting: Anesthesiology

## 2014-02-07 ENCOUNTER — Encounter (HOSPITAL_COMMUNITY): Payer: Self-pay | Admitting: *Deleted

## 2014-02-07 ENCOUNTER — Encounter (HOSPITAL_COMMUNITY)
Admission: RE | Disposition: A | Discharge status: Home / Self Care | Payer: Self-pay | Source: Ambulatory Visit | Attending: Gastroenterology

## 2014-02-07 ENCOUNTER — Ambulatory Visit (HOSPITAL_COMMUNITY)
Admission: RE | Admit: 2014-02-07 | Discharge: 2014-02-07 | Disposition: A | Discharge status: Home / Self Care | Payer: Medicare Other | Source: Ambulatory Visit | Attending: Gastroenterology | Admitting: Gastroenterology

## 2014-02-07 ENCOUNTER — Encounter (INDEPENDENT_AMBULATORY_CARE_PROVIDER_SITE_OTHER): Payer: Self-pay

## 2014-02-07 ENCOUNTER — Encounter (HOSPITAL_COMMUNITY): Payer: Medicare Other | Admitting: Anesthesiology

## 2014-02-07 DIAGNOSIS — Q398 Other congenital malformations of esophagus: Secondary | ICD-10-CM | POA: Insufficient documentation

## 2014-02-07 DIAGNOSIS — Z882 Allergy status to sulfonamides status: Secondary | ICD-10-CM | POA: Diagnosis not present

## 2014-02-07 DIAGNOSIS — Z87891 Personal history of nicotine dependence: Secondary | ICD-10-CM | POA: Insufficient documentation

## 2014-02-07 DIAGNOSIS — I10 Essential (primary) hypertension: Secondary | ICD-10-CM | POA: Insufficient documentation

## 2014-02-07 DIAGNOSIS — K2289 Other specified disease of esophagus: Secondary | ICD-10-CM | POA: Diagnosis not present

## 2014-02-07 DIAGNOSIS — K225 Diverticulum of esophagus, acquired: Secondary | ICD-10-CM | POA: Diagnosis not present

## 2014-02-07 DIAGNOSIS — Z880 Allergy status to penicillin: Secondary | ICD-10-CM | POA: Insufficient documentation

## 2014-02-07 DIAGNOSIS — E039 Hypothyroidism, unspecified: Secondary | ICD-10-CM | POA: Insufficient documentation

## 2014-02-07 DIAGNOSIS — K209 Esophagitis, unspecified without bleeding: Secondary | ICD-10-CM | POA: Diagnosis not present

## 2014-02-07 DIAGNOSIS — K228 Other specified diseases of esophagus: Secondary | ICD-10-CM | POA: Diagnosis present

## 2014-02-07 DIAGNOSIS — K219 Gastro-esophageal reflux disease without esophagitis: Secondary | ICD-10-CM | POA: Diagnosis not present

## 2014-02-07 DIAGNOSIS — Z886 Allergy status to analgesic agent status: Secondary | ICD-10-CM | POA: Diagnosis not present

## 2014-02-07 DIAGNOSIS — Z885 Allergy status to narcotic agent status: Secondary | ICD-10-CM | POA: Insufficient documentation

## 2014-02-07 DIAGNOSIS — R933 Abnormal findings on diagnostic imaging of other parts of digestive tract: Secondary | ICD-10-CM | POA: Diagnosis not present

## 2014-02-07 HISTORY — DX: Essential (primary) hypertension: I10

## 2014-02-07 HISTORY — DX: Unspecified osteoarthritis, unspecified site: M19.90

## 2014-02-07 HISTORY — PX: ESOPHAGOGASTRODUODENOSCOPY: SHX5428

## 2014-02-07 HISTORY — DX: Adverse effect of unspecified anesthetic, initial encounter: T41.45XA

## 2014-02-07 SURGERY — EGD (ESOPHAGOGASTRODUODENOSCOPY)
Anesthesia: General

## 2014-02-07 MED ORDER — PROPOFOL 10 MG/ML IV BOLUS
INTRAVENOUS | Status: AC
  Filled 2014-02-07: qty 20

## 2014-02-07 MED ORDER — ONDANSETRON HCL 4 MG/2ML IJ SOLN
INTRAMUSCULAR | Status: AC
  Filled 2014-02-07: qty 2

## 2014-02-07 MED ORDER — DEXAMETHASONE SODIUM PHOSPHATE 10 MG/ML IJ SOLN
INTRAMUSCULAR | Status: AC
  Filled 2014-02-07: qty 1

## 2014-02-07 MED ORDER — FENTANYL CITRATE 0.05 MG/ML IJ SOLN: 25.0000 ug | INTRAMUSCULAR | Status: DC | PRN

## 2014-02-07 MED ORDER — LIDOCAINE HCL (PF) 2 % IJ SOLN
INTRAMUSCULAR | Status: DC | PRN
  Administered 2014-02-07: 20 mg via INTRADERMAL

## 2014-02-07 MED ORDER — FENTANYL CITRATE 0.05 MG/ML IJ SOLN
INTRAMUSCULAR | Status: AC
  Filled 2014-02-07: qty 2

## 2014-02-07 MED ORDER — ONDANSETRON HCL 4 MG/2ML IJ SOLN
INTRAMUSCULAR | Status: DC | PRN
  Administered 2014-02-07: 4 mg via INTRAVENOUS

## 2014-02-07 MED ORDER — SUCCINYLCHOLINE CHLORIDE 20 MG/ML IJ SOLN
INTRAMUSCULAR | Status: DC | PRN
  Administered 2014-02-07: 80 mg via INTRAVENOUS

## 2014-02-07 MED ORDER — LACTATED RINGERS IV SOLN
INTRAVENOUS | Status: DC
  Administered 2014-02-07: 1000 mL via INTRAVENOUS

## 2014-02-07 MED ORDER — FENTANYL CITRATE 0.05 MG/ML IJ SOLN
INTRAMUSCULAR | Status: DC | PRN
Start: 2014-02-07 — End: 2014-02-07
  Administered 2014-02-07 (×2): 50 ug via INTRAVENOUS

## 2014-02-07 MED ORDER — PROPOFOL 10 MG/ML IV BOLUS
INTRAVENOUS | Status: DC | PRN
  Administered 2014-02-07: 150 mg via INTRAVENOUS

## 2014-02-07 MED ORDER — LIDOCAINE HCL (CARDIAC) 20 MG/ML IV SOLN
INTRAVENOUS | Status: AC
  Filled 2014-02-07: qty 5

## 2014-02-07 MED ORDER — DEXAMETHASONE SODIUM PHOSPHATE 10 MG/ML IJ SOLN
INTRAMUSCULAR | Status: DC | PRN
  Administered 2014-02-07: 10 mg via INTRAVENOUS

## 2014-02-07 NOTE — Anesthesia Postprocedure Evaluation (Signed)
  Anesthesia Post-op Note  Patient: Andrea Neal  Procedure(s) Performed: Procedure(s) (LRB): ESOPHAGOGASTRODUODENOSCOPY (EGD) (N/A)  Patient Location: PACU  Anesthesia Type: General  Level of Consciousness: awake and alert   Airway and Oxygen Therapy: Patient Spontanous Breathing  Post-op Pain: mild  Post-op Assessment: Post-op Vital signs reviewed, Patient's Cardiovascular Status Stable, Respiratory Function Stable, Patent Airway and No signs of Nausea or vomiting  Last Vitals:  Filed Vitals:   02/07/14 0903  BP: 133/75  Pulse: 75  Temp:   Resp: 17    Post-op Vital Signs: stable   Complications: No apparent anesthesia complications

## 2014-02-07 NOTE — Op Note (Signed)
[facility] [address] [place], [address]   OPERATIVE PROCEDURE REPORT  PATIENT: Andrea Neal, Andrea Neal  MR#: [id number] BIRTHDATE: 01/30/1934  GENDER: Female ENDOSCOPIST: Jeani Hawking, MD ASSISTANT:   Jimmey Ralph, RN, CGRN and Oletha Blend, technician  PROCEDURE DATE: 02/07/2014 PROCEDURE:   EGD with biopsies ASA CLASS:   Class III INDICATIONS:Abnormal esophagram. MEDICATIONS: MAC sedation, administered by CRNA   General Anesthesia TOPICAL ANESTHETIC:   none  DESCRIPTION OF PROCEDURE:   After the risks benefits and alternatives of the procedure were thoroughly explained, informed consent was obtained.  The Pentax Gastroscope [id number]  endoscope was introduced through the mouth  and advanced to the second portion of the duodenum Without limitations.      The instrument was slowly withdrawn as the mucosa was fully examined.    FINDINGS: Upon initial entry into the oral cavity there was evidence of food particles.  These were cleared with suctioning.  No food particles were around the ET tube.  Entry into the esophagus revealed a dilated mid esophagus and there was a lack of observable peristalsis.  Cold biopsies were obtained to evaluate for Scleroderma.  A good portion of the retained esophageal contents were suctioned, but the esophagus was not able to be cleared.  The Z-line was noted to be at 40 cm from the incisors and there was no evidence of any stirctures.  Approximately 5 cm proximal to the Z-line, at 35 cm, a wide mouthed diverticulum was identified.  The opening measured 3-4 cm and a significant amount of food material was in the diverticulum.  The endoscope was advanced into the gastric lumen and food contents were noted in the fundus.  In the distal gastric lumen her prior Billroth I anastamosis was encountered and it was patent.  The duodenum was normal. Retroflexed views revealed retained food contents.     The scope was then withdrawn  from the patient and the procedure terminated. Dr. Ezzard Standing was on hand to visualize the findings.  COMPLICATIONS: There were no complications.  IMPRESSION: 1) Large distal esophageal diverticulum with a 3-4 cm opening. Proximal edge at 31-32 cm from the incisors extending to 35 cm from the incisors. 2)  Retained food contents. 3) Moderate dilation of the mid esophagus. 4) Lack of observable peristasis in the esophagus.  RECOMMENDATIONS: 1) Chew food until it is liquified. 2) Avoid fiberous foods. 3) Follow up with Dr. Tyrone Sage and Dr. Ezzard Standing. 4) Dermatology consultation to evaluate for Scleroderma. 5) Await esophageal biopsy results.  _______________________________ eSignedJeani Hawking, MD 02/07/2014 8:56 AM    PATIENT NAME:  Andrea Neal, Andrea Neal MR#: [id number]

## 2014-02-07 NOTE — Transfer of Care (Signed)
Immediate Anesthesia Transfer of Care Note  Patient: Andrea Neal  Procedure(s) Performed: Procedure(s) (LRB): ESOPHAGOGASTRODUODENOSCOPY (EGD) (N/A)  Patient Location: PACU  Anesthesia Type: General  Level of Consciousness: sedated, patient cooperative and responds to stimulation  Airway & Oxygen Therapy: Patient Spontanous Breathing and Patient connected to face mask oxgen  Post-op Assessment: Report given to PACU RN and Post -op Vital signs reviewed and stable  Post vital signs: Reviewed and stable  Complications: No apparent anesthesia complications

## 2014-02-07 NOTE — Discharge Instructions (Signed)
Esophagogastroduodenoscopy Care After Refer to this sheet in the next few weeks. These instructions provide you with information on caring for yourself after your procedure. Your caregiver may also give you more specific instructions. Your treatment has been planned according to current medical practices, but problems sometimes occur. Call your caregiver if you have any problems or questions after your procedure.  HOME CARE INSTRUCTIONS  Do not eat or drink anything until the numbing medicine (local anesthetic) has worn off and your gag reflex has returned. You will know that the local anesthetic has worn off when you can swallow comfortably.  Do not drive for 12 hours after the procedure or as directed by your caregiver.  Only take medicines as directed by your caregiver. SEEK MEDICAL CARE IF:   You cannot stop coughing.  You are not urinating at all or less than usual. SEEK IMMEDIATE MEDICAL CARE IF:  You have difficulty swallowing.  You cannot eat or drink.  You have worsening throat or chest pain.  You have dizziness, lightheadedness, or you faint.  You have nausea or vomiting.  You have chills.  You have a fever.  You have severe abdominal pain.  You have black, tarry, or bloody stools. Document Released: 08/08/2012 Document Reviewed: 08/08/2012 [facility] Patient Information 2014 [place], [place].   General Anesthesia, Adult, Care After Refer to this sheet in the next few weeks. These instructions provide you with information on caring for yourself after your procedure. Your health care provider may also give you more specific instructions. Your treatment has been planned according to current medical practices, but problems sometimes occur. Call your health care provider if you have any problems or questions after your procedure. WHAT TO EXPECT AFTER THE PROCEDURE After the procedure, it is typical to experience:  Sleepiness.  Nausea and vomiting. HOME CARE  INSTRUCTIONS  For the first 24 hours after general anesthesia:  Have a responsible person with you.  Do not drive a car. If you are alone, do not take public transportation.  Do not drink alcohol.  Do not take medicine that has not been prescribed by your health care provider.  Do not sign important papers or make important decisions.  You may resume a normal diet and activities as directed by your health care provider.  Change bandages (dressings) as directed.  If you have questions or problems that seem related to general anesthesia, call the hospital and ask for the anesthetist or anesthesiologist on call. SEEK MEDICAL CARE IF:  You have nausea and vomiting that continue the day after anesthesia.  You develop a rash. SEEK IMMEDIATE MEDICAL CARE IF:   You have difficulty breathing.  You have chest pain.  You have any allergic problems. Document Released: 11/28/2000 Document Revised: 04/24/2013 Document Reviewed: 03/07/2013 [facility] Patient Information 2014 [place], [place].

## 2014-02-07 NOTE — Anesthesia Preprocedure Evaluation (Addendum)
Anesthesia Evaluation  Patient identified by MRN, date of birth, ID band Patient awake    Reviewed: Allergy & Precautions, H&P , NPO status , Patient's Chart, lab work & pertinent test results, reviewed documented beta blocker date and time   Airway Mallampati: II TM Distance: >3 FB Neck ROM: full    Dental no notable dental hx. (+) Teeth Intact, Dental Advisory Given   Pulmonary neg pulmonary ROS, former smoker,  breath sounds clear to auscultation  Pulmonary exam normal       Cardiovascular Exercise Tolerance: Good hypertension, Pt. on medications Rhythm:regular Rate:Normal     Neuro/Psych negative neurological ROS  negative psych ROS   GI/Hepatic negative GI ROS, Neg liver ROS, GERD-  Medicated and Controlled,  Endo/Other  negative endocrine ROS  Renal/GU negative Renal ROS  negative genitourinary   Musculoskeletal   Abdominal   Peds  Hematology negative hematology ROS (+)   Anesthesia Other Findings   Reproductive/Obstetrics negative OB ROS                          Anesthesia Physical Anesthesia Plan  ASA: II  Anesthesia Plan: General   Post-op Pain Management:    Induction: Intravenous  Airway Management Planned: Oral ETT  Additional Equipment:   Intra-op Plan:   Post-operative Plan: Extubation in OR  Informed Consent: I have reviewed the patients History and Physical, chart, labs and discussed the procedure including the risks, benefits and alternatives for the proposed anesthesia with the patient or authorized representative who has indicated his/her understanding and acceptance.   Dental Advisory Given  Plan Discussed with: CRNA and Surgeon  Anesthesia Plan Comments:         Anesthesia Quick Evaluation

## 2014-02-07 NOTE — H&P (View-Only) (Signed)
Re:   Andrea Neal DOB:   [birth year] MRN:   [id number]  ASSESSMENT AND PLAN: 1.  Distal esophageal diverticula.  Approx 5 - 6 cm above GE junction.  From Dr. Ermalene Searing notes, it appears this will have to be approached from the chest and not through the abdomen.  Also, as best we can work up, we need to make sure she does not have some distal esophageal disease, which has lead to increased esophageal pressure and thus this diverticula.  I reviewed the prior findings with the patient and will proceed with the following plan.  Plan:  1) Update barium swallow, 2) I talked to Dr. Elnoria Howard - who will arrange upper endo and manometry for the patient, 3) After the previous information is obtained, she will see Dr. Tyrone Sage.  I spoke to him briefly on the phone today and outlined the prior findings.  2.  History of prior Bilroth I for ulcer disease (1982) 3.  Hypothyroid 4.  GERD 5.  Hypothyroid, corrected.  Chief Complaint  Patient presents with  . eval dysphagia   REFERRING PHYSICIAN: RAMACHANDRAN,AJITH, MD  HISTORY OF PRESENT ILLNESS: Andrea Neal is a 80 y.o. (DOB: 21-Sep-1933)  white  female whose primary care physician is [facility], [place] and comes to me today for esophageal diverticula. She comes by herself. The patient has been followed by Dr. Chip Boer.    The patient complains of choking on any type of food. She feels at times like she has a tennis ball in her esophagus. She will spit up undigested food that she ate several days earlier.  She has "bad reflux".  She will wake up a 2 AM coughing, then she has some wheezing which will clear after a few hours. She had an attempted surgery by Dr. Daphine Deutscher in 2012, but her symptoms have only gotten worse since then.  She has a history of a Billroth I in 1982. She had an upper endo by Dr. Elnoria Howard 06/16/2011.  He was not able to visualize the diverticula because of food material, but he was able to get into the stomach.  An UGI -  04/23/2010 - showed  moderate stricture of the distal esophagus.  Very large diverticulum of the distal esophagus projecting to the right of midline. This is located above the distal stricture. Prior gastric surgery without evidence of ulcer or gastric mass.    She had an laparoscopic enterolysis by Dr. Daphine Deutscher 09/16/2010.  It appears that he was trying to get to the esophagus, but could not because of adhesions.  Dr. Daphine Deutscher did note on upper endoscopy at that time a broad based esophageal diverticula with old vegetation material in it.  The diverticula was about 5 or 6 cm above the GE junction.  Her symptoms have worsened since that surgery.  She had asked not to see Dr. Daphine Deutscher back.  He other abdominal surgery include a hysterectomy in [place], [place], in 1990.   No past medical history on file.   No past surgical history on file.    No current outpatient prescriptions on file.   No current facility-administered medications for this visit.      Allergies  Allergen Reactions  . Aspirin   . Codeine   . Penicillins   . Sulfur   . Ultram [Tramadol]   . Vicodin [Hydrocodone-Acetaminophen]   . Vioxx [Rofecoxib]     REVIEW OF SYSTEMS: Skin:  No history of rash.  No history of abnormal moles. Infection:  No  history of hepatitis or HIV.  No history of MRSA. Neurologic:  No history of stroke.  No history of seizure.  No history of headaches. Cardiac:  Was on antihypertensives, but off meds now. No history of heart disease.  No history of prior cardiac catheterization.  No history of seeing a cardiologist. Pulmonary:  Does not smoke cigarettes.  No asthma or bronchitis.  No OSA/CPAP.  Endocrine:  No diabetes. Hypothyroid. Gastrointestinal:  See HPI Urologic:  No history of kidney stones.  No history of bladder infections. Musculoskeletal:  L4-L5 fusion - 11/2008. Hematologic:  No bleeding disorder.  No history of anemia.  Not anticoagulated. Psycho-social:  The patient is oriented.   The  patient has no obvious psychologic or social impairment to understanding our conversation and plan.  SOCIAL and FAMILY HISTORY: Widowed. But has boyfriend - Brinson Frix. Has 2 sons Arlys John- Brian, 78 yo, in [place], and [place], 78 [place].  PHYSICAL EXAM: BP 136/80  Pulse 75  Temp(Src) 97.5 F (36.4 C)  Ht 5\' 5"  (1.651 m)  Wt 172 lb (78.019 kg)  BMI 28.62 kg/m2  General: WN WF who is alert and generally healthy appearing.  She look younger than her stated age. HEENT: Normal. Pupils equal. Neck: Supple. No mass.  No thyroid mass. Lymph Nodes:  No supraclavicular or cervical nodes. Lungs: Clear to auscultation and symmetric breath sounds. Heart:  RRR. No murmur or rub.  Abdomen: Soft. No mass. No tenderness. No hernia. Normal bowel sounds.  She has an old upper midline scar and a lower midline scar. Rectal: Not done. Extremities:  Good strength and ROM  in upper and lower extremities. Neurologic:  Grossly intact to motor and sensory function. Psychiatric: Has normal mood and affect. Behavior is normal.   DATA REVIEWED: Epic notes.  Ovidio Kinavid Leng Montesdeoca, MD,  [facility] [address]  [address]   [place], [place]    [id number] Phone:  [phone] FAX:  [phone]

## 2014-02-07 NOTE — Interval H&P Note (Signed)
History and Physical Interval Note:  02/07/2014 6:53 AM  Andrea Neal  has presented today for surgery, with the diagnosis of esophageal pouch/GERD  The various methods of treatment have been discussed with the patient and family. After consideration of risks, benefits and other options for treatment, the patient has consented to  Procedure(s): ESOPHAGOGASTRODUODENOSCOPY (EGD) (N/A) as a surgical intervention .  The patient's history has been reviewed, patient examined, no change in status, stable for surgery.  I have reviewed the patient's chart and labs.  Questions were answered to the patient's satisfaction.     [name]

## 2014-02-10 ENCOUNTER — Encounter (HOSPITAL_COMMUNITY)
Admission: RE | Disposition: A | Discharge status: Home / Self Care | Payer: Self-pay | Source: Ambulatory Visit | Attending: Gastroenterology

## 2014-02-10 ENCOUNTER — Ambulatory Visit (HOSPITAL_COMMUNITY)
Admission: RE | Admit: 2014-02-10 | Discharge: 2014-02-10 | Disposition: A | Discharge status: Home / Self Care | Payer: Medicare Other | Source: Ambulatory Visit | Attending: Gastroenterology | Admitting: Gastroenterology

## 2014-02-10 ENCOUNTER — Encounter (HOSPITAL_COMMUNITY): Payer: Self-pay | Admitting: Gastroenterology

## 2014-02-10 ENCOUNTER — Encounter: Payer: Medicare Other | Admitting: Cardiothoracic Surgery

## 2014-02-10 DIAGNOSIS — Z886 Allergy status to analgesic agent status: Secondary | ICD-10-CM | POA: Insufficient documentation

## 2014-02-10 DIAGNOSIS — Z885 Allergy status to narcotic agent status: Secondary | ICD-10-CM | POA: Diagnosis not present

## 2014-02-10 DIAGNOSIS — I10 Essential (primary) hypertension: Secondary | ICD-10-CM | POA: Diagnosis not present

## 2014-02-10 DIAGNOSIS — Z87891 Personal history of nicotine dependence: Secondary | ICD-10-CM | POA: Insufficient documentation

## 2014-02-10 DIAGNOSIS — R1310 Dysphagia, unspecified: Secondary | ICD-10-CM | POA: Diagnosis not present

## 2014-02-10 DIAGNOSIS — Z888 Allergy status to other drugs, medicaments and biological substances status: Secondary | ICD-10-CM | POA: Insufficient documentation

## 2014-02-10 DIAGNOSIS — Z880 Allergy status to penicillin: Secondary | ICD-10-CM | POA: Diagnosis not present

## 2014-02-10 DIAGNOSIS — M129 Arthropathy, unspecified: Secondary | ICD-10-CM | POA: Diagnosis not present

## 2014-02-10 DIAGNOSIS — K219 Gastro-esophageal reflux disease without esophagitis: Secondary | ICD-10-CM | POA: Diagnosis not present

## 2014-02-10 DIAGNOSIS — Z882 Allergy status to sulfonamides status: Secondary | ICD-10-CM | POA: Diagnosis not present

## 2014-02-10 DIAGNOSIS — E079 Disorder of thyroid, unspecified: Secondary | ICD-10-CM | POA: Diagnosis not present

## 2014-02-10 DIAGNOSIS — R079 Chest pain, unspecified: Secondary | ICD-10-CM | POA: Diagnosis not present

## 2014-02-10 HISTORY — PX: ESOPHAGEAL MANOMETRY: SHX5429

## 2014-02-10 SURGERY — MANOMETRY, ESOPHAGUS

## 2014-02-10 MED ORDER — LIDOCAINE VISCOUS 2 % MT SOLN
OROMUCOSAL | Status: AC
  Filled 2014-02-10: qty 15

## 2014-02-10 SURGICAL SUPPLY — 1 items: FACESHIELD LNG OPTICON STERILE (SAFETY) IMPLANT

## 2014-02-11 ENCOUNTER — Institutional Professional Consult (permissible substitution) (INDEPENDENT_AMBULATORY_CARE_PROVIDER_SITE_OTHER): Payer: Medicare Other | Admitting: Cardiothoracic Surgery

## 2014-02-11 ENCOUNTER — Encounter (HOSPITAL_COMMUNITY): Payer: Self-pay | Admitting: Gastroenterology

## 2014-02-11 VITALS — BP 139/91 | HR 90 | Resp 16 | Ht 65.0 in | Wt 165.0 lb

## 2014-02-11 DIAGNOSIS — R1310 Dysphagia, unspecified: Secondary | ICD-10-CM

## 2014-02-11 DIAGNOSIS — K225 Diverticulum of esophagus, acquired: Secondary | ICD-10-CM

## 2014-02-11 DIAGNOSIS — Z8739 Personal history of other diseases of the musculoskeletal system and connective tissue: Secondary | ICD-10-CM

## 2014-02-11 DIAGNOSIS — Q396 Congenital diverticulum of esophagus: Secondary | ICD-10-CM

## 2014-02-11 NOTE — Progress Notes (Signed)
[address]       [place] [address]             [phone]                    VESPER TRANT [facility] Medical Record #[id number] Date of Birth: 1933/09/09  Referring: Kandis Cocking, MD Primary Care: Georgianne Fick, MD  Chief Complaint:    Chief Complaint  Patient presents with  . Dysphagia    large distal esophageal divertculum....UPPER GI WITH KUB..01/28/14...MANOMETRY 02/10/14  ENDOSCOPY/BXS 02/07/14    History of Present Illness:    Andrea Neal 80 y.o. female is seen in the office  today for large esophageal diverticulum. The patient notes that she's had trouble swallowing for many years. In 1982 while living in [place] she had a Billroth I-colectomy for ulcerative disease. She has had significant reflux disease over the years. In 2012 Dr. Daphine Deutscher attempted repair of hernia and diverticulum laparoscopically but was unable to succeed in completing the intervention. Since that time the patient notes the symptoms have become worse, with reflux at night occasionally waking up strangling. She sleeps in a chair now. She has a history of scleroderma. She denies any cardiac disease, she did have a stress test by Dr. Donnie Aho on 08/19/2013 and was told it was "okay".  The patient has had upper GI endoscopy , barium swallow and manometrics the results of which are pending today.  Current Activity/ Functional Status:  Patient is independent with mobility/ambulation, transfers, ADL's, IADL's.   Zubrod Score: At the time of surgery this patient's most appropriate activity status/level should be described as: []     0    Normal activity, no symptoms []     1    Restricted in physical strenuous activity but ambulatory, able to do out light work []     2    Ambulatory and capable of self care, unable to do work activities, up and about               >50 % of waking hours                              []     3    Only limited self care, in bed greater than  50% of waking hours []     4    Completely disabled, no self care, confined to bed or chair []     5    Moribund   Past Medical History  Diagnosis Date  . GERD (gastroesophageal reflux disease)   . Thyroid disease   . Hypertension     hx of high bp, takes diuretic now  . Arthritis   . Complication of anesthesia 2010    slow to awaken after back surgery at [facility]    Past Surgical History  Procedure Laterality Date  . Hysterotomy  1990    tumor  . Ulcers  1982    1/3 of stomach removed for ulcers  . Cesarean section  [id number]  . Appendectomy  1968    while having c-sec  . Spine surgery  2010    4/5  . Carpectomy    . Carpal tunnel release  2011    rt hand  . Abdominal hysterectomy  1980's  . Esophagogastroduodenoscopy N/A 02/07/2014    Procedure: ESOPHAGOGASTRODUODENOSCOPY (EGD);  Surgeon: [name], MD;  Location: [facility];  Service: Endoscopy;  Laterality: N/A;  . Esophageal manometry N/A 02/10/2014    Procedure: ESOPHAGEAL MANOMETRY (EM);  Surgeon: [name], MD;  Location: [facility];  Service: Endoscopy;  Laterality: N/A;  . Bilroth i procedure  1982    FOR ULCER DISEASE    Family History  Problem Relation Age of Onset  . Stroke Mother   . Heart disease Father     History   Social History  . Marital Status: Widowed    Spouse Name: N/A    Number of Children: N/A  . Years of Education: N/A   Occupational History  . Not on file.   Social History Main Topics  . Smoking status: Former Smoker -- 0.25 packs/day for 10 years    Quit date: 01/24/1984  . Smokeless tobacco: Never Used  . Alcohol Use: No  . Drug Use: No  . Sexual Activity: Not on file   Other Topics Concern  . Not on file   Social History Narrative  . No narrative on file    History  Smoking status  . Former Smoker -- 0.25 packs/day for 10 years  . Quit date: 01/24/1984  Smokeless tobacco  . Never Used    History  Alcohol Use No     Allergies  Allergen  Reactions  . Aspirin     Ulcers   . Codeine     Difficulty swallowing   . Dilaudid [Hydromorphone Hcl]     "made me nervous and jittery"  . Fentanyl Nausea Only    Patch form  . Morphine And Related Nausea And Vomiting  . Oxycodone Nausea And Vomiting  . Penicillins     "skin feels numb"  . Ultram [Tramadol]     "made me jittery and nervous"  . Vioxx [Rofecoxib] Swelling  . Sulfur Itching and Rash  . Vicodin [Hydrocodone-Acetaminophen] Rash    Current Outpatient Prescriptions  Medication Sig Dispense Refill  . clonazePAM (KLONOPIN) 0.5 MG tablet Take 0.5 mg by mouth 2 (two) times daily as needed for anxiety.      . hydrochlorothiazide (HYDRODIURIL) 25 MG tablet Take 25 mg by mouth every morning.       Marland Kitchen levothyroxine (SYNTHROID, LEVOTHROID) 25 MCG tablet Take 25 mcg by mouth daily before breakfast.      . MAGNESIUM PO Take 1 tablet by mouth daily.      . Multiple Vitamin (MULTIVITAMIN WITH MINERALS) TABS tablet Take 1 tablet by mouth daily.      . ranitidine (ZANTAC) 300 MG tablet Take 300 mg by mouth 2 (two) times daily.      . traZODone (DESYREL) 100 MG tablet Take 100 mg by mouth at bedtime as needed for sleep.        No current facility-administered medications for this visit.     Review of Systems:     Cardiac Review of Systems: Y or N  Chest Pain [  n  ]  Resting SOB [n   ] Exertional SOB  [ n ]  Orthopnea [n  ]   Pedal Edema [ n  ]    Palpitations [  n] Syncope  [n ]   Presyncope [ n  ]  General Review of Systems: [Y] = yes [  ]=no Constitional: recent weight change [ n ];  Wt loss over the last 3 months [   ] anorexia [  ]; fatigue [  ]; nausea [  ]; night sweats [  ]; fever [  ];  or chills [  ];          Dental: poor dentition[  ]; Last Dentist visit:   Eye : blurred vision [  ]; diplopia [   ]; vision changes [  ];  Amaurosis fugax[  ]; Resp: cough [  ];  wheezing[  ];  hemoptysis[  ]; shortness of breath[  ]; paroxysmal nocturnal dyspnea[  ]; dyspnea on exertion[   ]; or orthopnea[  ];  GI:  gallstones[  ], vomiting[  ];  dysphagia[  ]; melena[n  ];  hematochezia [n  ]; heartburn[ y ];   Hx of  Colonoscopyy[  ]; GU: kidney stones [n  ]; hematuria[n  ];   dysuria [ n ];  nocturia[ n ];  history of     obstruction [  ]; urinary frequency [ n ]             Skin: rash, swelling[  ];, hair loss[  ];  peripheral edema[  ];  or itching[  ]; Musculosketetal: myalgias[  ];  joint swelling[  ];  joint erythema[  ];  joint pain[  ];  back pain[ n ];  Heme/Lymph: bruising[  ];  bleeding[  ];  anemia[  ];  Neuro: TIA[n  ];  headaches[  ];  stroke[n  ];  vertigo[  ];  seizures[  ];   paresthesias[  ];  difficulty walking[  ];  Psych:depression[  ]; anxiety[  ];  Endocrine: diabetes[  ];  thyroid dysfunction[  ];  Immunizations: Flu up to date [  y]; Pneumococcal up to date [place]ienne  ];  Other:  Physical Exam: BP 139/91  Pulse 90  Resp 16  Ht 5\' 5"  (1.651 m)  Wt 165 lb (74.844 kg)  BMI 27.46 kg/m2  SpO2 96%  PHYSICAL EXAMINATION:  General appearance: alert, cooperative and no distress Neurologic: intact Heart: regular rate and rhythm, S1, S2 normal, no murmur, click, rub or gallop Lungs: clear to auscultation bilaterally and normal percussion bilaterally Abdomen: soft, non-tender; bowel sounds normal; no masses,  no organomegaly Extremities: extremities normal, atraumatic, no cyanosis or edema and Homans sign is negative, no sign of DVT  patient has no cervical supraclavicular adenopathy, she has no carotid bruits  abdominal exam shows well-healed midline abdominal incision from previous gastric resection and lower abdominal incision from C-sections x2  Diagnostic Studies & Laboratory data:     Recent Radiology Findings:   Dg Ugi W/kub  01/28/2014   CLINICAL DATA:  History of large distal esophageal diverticulum.  EXAM: UPPER GI SERIES WITH KUB  TECHNIQUE: After obtaining a scout radiograph a routine upper GI series was performed using thin and high density  barium.  FLUOROSCOPY TIME:  3 min and 8 seconds.  COMPARISON:  04/23/2010  FINDINGS: Pre-procedure KUB shows a normal bowel gas pattern. Patient is status post lower lumbar fusion.  Frontal and lateral views of the hypopharynx while swallowing are normal.  Additional swallows of thin barium demonstrate a markedly dilated esophagus which is food filled. As before, a very large diverticulum arises from the right aspect of the distal esophagus. The diverticulum is also filled with food debris. Despite multiple different positions with the patient both upright and horizontal, only a minimal amount of barium passed from the distal esophagus and the stomach. As such, gastric distention could not be achieved. There was clearly per pharyngeal flow of contrast and residual food material into the diverticulum rather than through the esophagogastric junction. To and  fro movement of food and contrast in and out of the diverticulum was observed in real-time.  Surgical clips are identified in the region of the esophagogastric junction. Surgical suture line is seen in the region of the distal stomach. Again, the stomach is not well evaluated secondary to the inability to obtain gastric distention.  IMPRESSION: Very large distal esophageal diverticulum as seen previously. On the previous exam, there did appear to be better flow of contrast through the esophagogastric junction into the stomach. On today's study there was clearly preferential flow of contrast into the diverticulum with only very minimal passage of contrast through the EG junction into the stomach. As such, adequate gastric distension for assessment of the stomach could not be obtained. Imaging features are suggestive of distal esophageal stricture at a point distal to the diverticulum.  Markedly dilated esophagus diffusely.   Electronically Signed   By: Kennith CenterEric  Mansell M.D.   On: 01/28/2014 10:51      Recent Lab Findings: Lab Results  Component Value Date   WBC  4.9 09/24/2010   HGB 12.4 09/24/2010   HCT 38.4 09/24/2010   PLT 158 09/24/2010   GLUCOSE 191* 09/22/2010   ALT 13 09/16/2010   AST 21 09/16/2010   NA 137 09/22/2010   K 3.8 09/22/2010   CL 103 09/22/2010   CREATININE 0.74 09/22/2010   BUN 13 09/22/2010   CO2 28 09/22/2010   TSH 0.494 Test methodology is 3rd generation TSH 11/19/2008   INR 1.1 11/18/2008   ENDO: 1) Large distal esophageal diverticulum with a 3-4 cm opening. Proximal edge at 31-32 cm from the incisors extending to 35 cm from the incisors. 2) Retained food contents. 3) Moderate dilation of the mid esophagus. 4) Lack of observable peristasis in the esophagus.     Assessment / Plan:   Large esophageal diverticulum with probable distal esophageal stricture with esophageal dysmotility in the setting of history of scleroderma Patient's manometric studies are pending I have reviewed with her the findings of her endoscopy and barium swallow. With her increasing symptoms surgical intervention will be necessary to correct her underlying problem, with presumed esophageal dysmotility and esophageal stricture , merely stapling the diverticulum will result in extremely poor outcome.  I will review the patient's findings with Dr. Ezzard StandingNewman and await the final results of her manometric studies that are still pending and plan to see her back next week to further outline a treatment plan.   I spent 40 minutes counseling the patient face to face. The total time spent in the appointment was 60 minutes.  Delight OvensEdward B Jarmal Lewelling MD      [address] [place] [id number] Office [phone]   Beeper [phone]  02/11/2014 4:22 PM

## 2014-02-11 NOTE — H&P (Signed)
   Andrea Neal HPI: 80 year old female with a distal esophageal diverticulum.  She is here today to undergo a manometry to ensure that there is no evidence of achalasia before surgical intervention.  Past Medical History  Diagnosis Date  . GERD (gastroesophageal reflux disease)   . Thyroid disease   . Hypertension     hx of high bp, takes diuretic now  . Arthritis   . Complication of anesthesia 2010    slow to awaken after back surgery at [facility]    Past Surgical History  Procedure Laterality Date  . Hysterotomy  1990    tumor  . Ulcers  1982    1/3 of stomach removed for ulcers  . Cesarean section  [id number]  . Appendectomy  1968    while having c-sec  . Spine surgery  2010    4/5  . Carpectomy    . Carpal tunnel release  2011    rt hand  . Abdominal hysterectomy  1980's  . Esophagogastroduodenoscopy N/A 02/07/2014    Procedure: ESOPHAGOGASTRODUODENOSCOPY (EGD);  Surgeon: [name], MD;  Location: [facility];  Service: Endoscopy;  Laterality: N/A;  . Esophageal manometry N/A 02/10/2014    Procedure: ESOPHAGEAL MANOMETRY (EM);  Surgeon: [name], MD;  Location: [facility];  Service: Endoscopy;  Laterality: N/A;    Family History  Problem Relation Age of Onset  . Stroke Mother   . Heart disease Father     Social History:  reports that she quit smoking about 30 years ago. She has never used smokeless tobacco. She reports that she does not drink alcohol or use illicit drugs.  Allergies:  Allergies  Allergen Reactions  . Aspirin     Ulcers   . Codeine     Difficulty swallowing   . Dilaudid [Hydromorphone Hcl]     "made me nervous and jittery"  . Fentanyl Nausea Only    Patch form  . Morphine And Related Nausea And Vomiting  . Oxycodone Nausea And Vomiting  . Penicillins     "skin feels numb"  . Ultram [Tramadol]     "made me jittery and nervous"  . Vioxx [Rofecoxib] Swelling  . Sulfur Itching and Rash  . Vicodin  [Hydrocodone-Acetaminophen] Rash    Medications: Scheduled: Continuous:  No results found for this or any previous visit (from the past 24 hour(s)).   No results found.  ROS:  As stated above in the HPI otherwise negative.  There were no vitals taken for this visit.    PE: No performed.  Assessment/Plan: 1) Esophageal diverticulum - Manometry today.  [name] 02/11/2014, 1:22 PM

## 2014-02-20 ENCOUNTER — Ambulatory Visit (INDEPENDENT_AMBULATORY_CARE_PROVIDER_SITE_OTHER): Payer: Medicare Other | Admitting: Cardiothoracic Surgery

## 2014-02-20 ENCOUNTER — Encounter: Payer: Self-pay | Admitting: Cardiothoracic Surgery

## 2014-02-20 VITALS — BP 132/82 | HR 74 | Resp 20 | Ht 65.0 in | Wt 165.0 lb

## 2014-02-20 DIAGNOSIS — Q398 Other congenital malformations of esophagus: Secondary | ICD-10-CM

## 2014-02-20 DIAGNOSIS — Z8739 Personal history of other diseases of the musculoskeletal system and connective tissue: Secondary | ICD-10-CM | POA: Diagnosis not present

## 2014-02-20 DIAGNOSIS — M349 Systemic sclerosis, unspecified: Secondary | ICD-10-CM | POA: Diagnosis not present

## 2014-02-20 DIAGNOSIS — Q396 Congenital diverticulum of esophagus: Secondary | ICD-10-CM

## 2014-02-20 DIAGNOSIS — R1310 Dysphagia, unspecified: Secondary | ICD-10-CM | POA: Diagnosis not present

## 2014-02-20 NOTE — Progress Notes (Addendum)
[address]       [place] [id number]             [phone]                    Andrea HippoMiriam D Neal [facility] Medical Record #[id number] Date of Birth: 11-29-1933 [place] Referring: Andrea CockingNewman, Andrea H, MD Primary Care: Andrea FickAMACHANDRAN,AJITH, MD  Chief Complaint:    Chief Complaint  Patient presents with  . Follow-up    1 week f/u, further discuss surgery     History of Present Illness:    Andrea MajorMiriam D Crail 80 y.o. female is seen in the office  today for large esophageal diverticulum. The patient notes that she's had trouble swallowing for many years. In 1982 while living in [place] she had a Billroth I-colectomy for ulcerative disease. She has had significant reflux disease over the years. In 2012 Dr. Daphine Neal attempted repair of hernia and diverticulum laparoscopically but was unable to succeed in completing the intervention. Since that time the patient notes the symptoms have become worse, with reflux at night occasionally waking up strangling. She sleeps in a chair now. She has a history of scleroderma. She denies any cardiac disease, she did have a stress test by Dr. Donnie Ahoilley on 08/19/2013 and was told it was "okay".  The patient has had upper GI endoscopy , barium swallow and manometrics have been done.  Current Activity/ Functional Status:  Patient is independent with mobility/ambulation, transfers, ADL's, IADL's.   Zubrod Score: At the time of surgery this patient's most appropriate activity status/level should be described as: []     0    Normal activity, no symptoms [x]     1    Restricted in physical strenuous activity but ambulatory, able to do out light work []     2    Ambulatory and capable of self care, unable to do work activities, up and about               >50 % of waking hours                              []     3    Only limited self care, in bed greater than 50% of waking hours []     4    Completely disabled, no self care, confined to bed or  chair []     5    Moribund   Past Medical History  Diagnosis Date  . GERD (gastroesophageal reflux disease)   . Thyroid disease   . Hypertension     hx of high bp, takes diuretic now  . Arthritis   . Complication of anesthesia 2010    slow to awaken after [facility]    Past Surgical History  Procedure Laterality Date  . Hysterotomy  1990    tumor  . Ulcers  1982    1/3 of stomach removed for ulcers  . Cesarean section  [id number]  . Appendectomy  1968    while having c-sec  . Spine surgery  2010    4/5  . Carpectomy    . Carpal tunnel release  2011    rt hand  . Abdominal hysterectomy  1980's  . Esophagogastroduodenoscopy N/A 02/07/2014    Procedure: ESOPHAGOGASTRODUODENOSCOPY (EGD);  Surgeon: Andrea BelfastPatrick D Hung, MD;  [facility];  Service: Endoscopy;  Laterality: N/A;  . Esophageal  manometry N/A 02/10/2014    Procedure: ESOPHAGEAL MANOMETRY (EM);  Surgeon: Andrea Belfast, MD;  Location: [facility];  Service: Endoscopy;  Laterality: N/A;  . Bilroth i procedure  1982    FOR ULCER DISEASE    Family History  Problem Relation Age of Onset  . Stroke Mother   . Heart disease Father     History   Social History  . Marital Status: Widowed    Spouse Name: N/A    Number of Children: N/A  . Years of Education: N/A   Occupational History  . Not on file.   Social History Main Topics  . Smoking status: Former Smoker -- 0.25 packs/day for 10 years    Quit date: 01/24/1984  . Smokeless tobacco: Never Used  . Alcohol Use: No  . Drug Use: No  . Sexual Activity: Not on file      History  Smoking status  . Former Smoker -- 0.25 packs/day for 10 years  . Quit date: 01/24/1984  Smokeless tobacco  . Never Used    History  Alcohol Use No     Allergies  Allergen Reactions  . Aspirin     Ulcers   . Codeine     Difficulty swallowing   . Dilaudid [Hydromorphone Hcl]     "made me nervous and jittery"  . Fentanyl Nausea Only    Patch form   . Morphine And Related Nausea And Vomiting  . Oxycodone Nausea And Vomiting  . Penicillins     "skin feels numb"  . Ultram [Tramadol]     "made me jittery and nervous"  . Vioxx [Rofecoxib] Swelling  . Sulfur Itching and Rash  . Vicodin [Hydrocodone-Acetaminophen] Rash    Current Outpatient Prescriptions  Medication Sig Dispense Refill  . clonazePAM (KLONOPIN) 0.5 MG tablet Take 0.5 mg by mouth 2 (two) times daily as needed for anxiety.      . hydrochlorothiazide (HYDRODIURIL) 25 MG tablet Take 25 mg by mouth every morning.       Marland Kitchen levothyroxine (SYNTHROID, LEVOTHROID) 25 MCG tablet Take 25 mcg by mouth daily before breakfast.      . MAGNESIUM PO Take 1 tablet by mouth daily.      . Multiple Vitamin (MULTIVITAMIN WITH MINERALS) TABS tablet Take 1 tablet by mouth daily.      . ranitidine (ZANTAC) 300 MG tablet Take 300 mg by mouth 2 (two) times daily.      . traZODone (DESYREL) 100 MG tablet Take 100 mg by mouth at bedtime as needed for sleep.        No current facility-administered medications for this visit.     Review of Systems:     Cardiac Review of Systems: Y or N  Chest Pain [  n  ]  Resting SOB [n   ] Exertional SOB  [ n ]  Orthopnea [n  ]   Pedal Edema [ n  ]    Palpitations [  n] Syncope  [n ]   Presyncope [ n  ]  General Review of Systems: [Y] = yes [  ]=no Constitional: recent weight change [ n ];  Wt loss over the last 3 months [   ] anorexia [  ]; fatigue [  ]; nausea [  ]; night sweats [  ]; fever [  ]; or chills [  ];          Dental: poor dentition[  ]; Last Dentist visit:   Eye : blurred  vision [  ]; diplopia [   ]; vision changes [  ];  Amaurosis fugax[  ]; Resp: cough [  ];  wheezing[  ];  hemoptysis[  ]; shortness of breath[  ]; paroxysmal nocturnal dyspnea[  ]; dyspnea on exertion[  ]; or orthopnea[  ];  GI:  gallstones[  ], vomiting[  ];  dysphagia[  ]; melena[n  ];  hematochezia [n  ]; heartburn[ y ];   Hx of  Colonoscopyy[  ]; GU: kidney stones [n  ];  hematuria[n  ];   dysuria [ n ];  nocturia[ n ];  history of     obstruction [  ]; urinary frequency [ n ]             Skin: rash, swelling[  ];, hair loss[  ];  peripheral edema[  ];  or itching[  ]; Musculosketetal: myalgias[  ];  joint swelling[  ];  joint erythema[  ];  joint pain[  ];  back pain[ n ];  Heme/Lymph: bruising[  ];  bleeding[  ];  anemia[  ];  Neuro: TIA[n  ];  headaches[  ];  stroke[n  ];  vertigo[  ];  seizures[  ];   paresthesias[  ];  difficulty walking[  ];  Psych:depression[  ]; anxiety[  ];  Endocrine: diabetes[  ];  thyroid dysfunction[  ];  Immunizations: Flu up to date [  y]; Pneumococcal up to date [place].Etienne  ];  Other:  Physical Exam: BP 132/82  Pulse 74  Resp 20  Ht 5\' 5"  (1.651 m)  Wt 165 lb (74.844 kg)  BMI 27.46 kg/m2  SpO2 96%  PHYSICAL EXAMINATION:  General appearance: alert, cooperative and no distress Neurologic: intact Heart: regular rate and rhythm, S1, S2 normal, no murmur, click, rub or gallop Lungs: clear to auscultation bilaterally and normal percussion bilaterally Abdomen: soft, non-tender; bowel sounds normal; no masses,  no organomegaly Extremities: extremities normal, atraumatic, no cyanosis or edema and Homans sign is negative, no sign of DVT  patient has no cervical supraclavicular adenopathy, she has no carotid bruits  abdominal exam shows well-healed midline abdominal incision from previous gastric resection and lower abdominal incision from C-sections x2  Diagnostic Studies & Laboratory data:     Recent Radiology Findings:   Dg Ugi W/kub  01/28/2014   CLINICAL DATA:  History of large distal esophageal diverticulum.  EXAM: UPPER GI SERIES WITH KUB  TECHNIQUE: After obtaining a scout radiograph a routine upper GI series was performed using thin and high density barium.  FLUOROSCOPY TIME:  3 min and 8 seconds.  COMPARISON:  04/23/2010  FINDINGS: Pre-procedure KUB shows a normal bowel gas pattern. Patient is status post lower lumbar fusion.   Frontal and lateral views of the hypopharynx while swallowing are normal.  Additional swallows of thin barium demonstrate a markedly dilated esophagus which is food filled. As before, a very large diverticulum arises from the right aspect of the distal esophagus. The diverticulum is also filled with food debris. Despite multiple different positions with the patient both upright and horizontal, only a minimal amount of barium passed from the distal esophagus and the stomach. As such, gastric distention could not be achieved. There was clearly per pharyngeal flow of contrast and residual food material into the diverticulum rather than through the esophagogastric junction. To and fro movement of food and contrast in and out of the diverticulum was observed in real-time.  Surgical clips are identified in the region of the esophagogastric  junction. Surgical suture line is seen in the region of the distal stomach. Again, the stomach is not well evaluated secondary to the inability to obtain gastric distention.  IMPRESSION: Very large distal esophageal diverticulum as seen previously. On the previous exam, there did appear to be better flow of contrast through the esophagogastric junction into the stomach. On today's study there was clearly preferential flow of contrast into the diverticulum with only very minimal passage of contrast through the EG junction into the stomach. As such, adequate gastric distension for assessment of the stomach could not be obtained. Imaging features are suggestive of distal esophageal stricture at a point distal to the diverticulum.  Markedly dilated esophagus diffusely.   Electronically Signed   By: Kennith CenterEric  Mansell M.D.   On: 01/28/2014 10:51      Recent Lab Findings: Lab Results  Component Value Date   WBC 4.9 09/24/2010   HGB 12.4 09/24/2010   HCT 38.4 09/24/2010   PLT 158 09/24/2010   GLUCOSE 191* 09/22/2010   ALT 13 09/16/2010   AST 21 09/16/2010   NA 137 09/22/2010   K 3.8  09/22/2010   CL 103 09/22/2010   CREATININE 0.74 09/22/2010   BUN 13 09/22/2010   CO2 28 09/22/2010   TSH 0.494 Test methodology is 3rd generation TSH 11/19/2008   INR 1.1 11/18/2008   ENDO: 1) Large distal esophageal diverticulum with a 3-4 cm opening. Proximal edge at 31-32 cm from the incisors extending to 35 cm from the incisors. 2) Retained food contents. 3) Moderate dilation of the mid esophagus. 4) Lack of observable peristasis in the esophagus.     Assessment / Plan:   Large esophageal diverticulum with probable distal esophageal stricture with esophageal dysmotility in the setting of history of scleroderma I have reviewed with her the findings of her endoscopy and barium swallow.  I suggested to the patient that surgical resection of the diverticulum with myomotomy vs partial esophageal resection  Could  be the logical treatment for her problem,  I also suggested to her referral to a center with more redo gastric surgery expertise as the current resection with the previously placed Billroth I, large diverticulum, esophageal dysmotility, and history of scleroderma would carry significant risk. She is agreeable with referral.    Delight OvensEdward B Gerhardt MD      [address] [place] [id number] Office [phone]   Beeper [phone] Cell  [phone] 02/20/2014 1:40 PM

## 2014-03-17 DIAGNOSIS — Q398 Other congenital malformations of esophagus: Secondary | ICD-10-CM | POA: Diagnosis not present

## 2014-06-09 DIAGNOSIS — M25562 Pain in left knee: Secondary | ICD-10-CM | POA: Diagnosis not present

## 2014-06-09 DIAGNOSIS — I10 Essential (primary) hypertension: Secondary | ICD-10-CM | POA: Diagnosis not present

## 2014-06-09 DIAGNOSIS — R7301 Impaired fasting glucose: Secondary | ICD-10-CM | POA: Diagnosis not present

## 2014-06-16 DIAGNOSIS — E039 Hypothyroidism, unspecified: Secondary | ICD-10-CM | POA: Diagnosis not present

## 2014-06-16 DIAGNOSIS — M150 Primary generalized (osteo)arthritis: Secondary | ICD-10-CM | POA: Diagnosis not present

## 2014-06-16 DIAGNOSIS — R7301 Impaired fasting glucose: Secondary | ICD-10-CM | POA: Diagnosis not present

## 2014-06-16 DIAGNOSIS — I10 Essential (primary) hypertension: Secondary | ICD-10-CM | POA: Diagnosis not present

## 2014-06-17 DIAGNOSIS — Z23 Encounter for immunization: Secondary | ICD-10-CM | POA: Diagnosis not present

## 2014-10-09 DIAGNOSIS — H25013 Cortical age-related cataract, bilateral: Secondary | ICD-10-CM | POA: Diagnosis not present

## 2014-10-09 DIAGNOSIS — H35363 Drusen (degenerative) of macula, bilateral: Secondary | ICD-10-CM | POA: Diagnosis not present

## 2014-10-09 DIAGNOSIS — H2513 Age-related nuclear cataract, bilateral: Secondary | ICD-10-CM | POA: Diagnosis not present

## 2014-12-08 DIAGNOSIS — Z0000 Encounter for general adult medical examination without abnormal findings: Secondary | ICD-10-CM | POA: Diagnosis not present

## 2014-12-08 DIAGNOSIS — Z1389 Encounter for screening for other disorder: Secondary | ICD-10-CM | POA: Diagnosis not present

## 2014-12-08 DIAGNOSIS — M150 Primary generalized (osteo)arthritis: Secondary | ICD-10-CM | POA: Diagnosis not present

## 2014-12-08 DIAGNOSIS — E039 Hypothyroidism, unspecified: Secondary | ICD-10-CM | POA: Diagnosis not present

## 2014-12-08 DIAGNOSIS — I10 Essential (primary) hypertension: Secondary | ICD-10-CM | POA: Diagnosis not present

## 2014-12-15 DIAGNOSIS — E039 Hypothyroidism, unspecified: Secondary | ICD-10-CM | POA: Diagnosis not present

## 2014-12-15 DIAGNOSIS — R7301 Impaired fasting glucose: Secondary | ICD-10-CM | POA: Diagnosis not present

## 2014-12-15 DIAGNOSIS — I10 Essential (primary) hypertension: Secondary | ICD-10-CM | POA: Diagnosis not present

## 2014-12-15 DIAGNOSIS — E782 Mixed hyperlipidemia: Secondary | ICD-10-CM | POA: Diagnosis not present

## 2015-02-18 DIAGNOSIS — H5203 Hypermetropia, bilateral: Secondary | ICD-10-CM | POA: Diagnosis not present

## 2015-02-18 DIAGNOSIS — H2513 Age-related nuclear cataract, bilateral: Secondary | ICD-10-CM | POA: Diagnosis not present

## 2015-02-18 DIAGNOSIS — H524 Presbyopia: Secondary | ICD-10-CM | POA: Diagnosis not present

## 2015-02-18 DIAGNOSIS — H43393 Other vitreous opacities, bilateral: Secondary | ICD-10-CM | POA: Diagnosis not present

## 2015-02-18 DIAGNOSIS — H3589 Other specified retinal disorders: Secondary | ICD-10-CM | POA: Diagnosis not present

## 2015-02-18 DIAGNOSIS — H43813 Vitreous degeneration, bilateral: Secondary | ICD-10-CM | POA: Diagnosis not present

## 2015-02-18 DIAGNOSIS — H52223 Regular astigmatism, bilateral: Secondary | ICD-10-CM | POA: Diagnosis not present

## 2015-02-23 DIAGNOSIS — R7301 Impaired fasting glucose: Secondary | ICD-10-CM | POA: Diagnosis not present

## 2015-02-23 DIAGNOSIS — E782 Mixed hyperlipidemia: Secondary | ICD-10-CM | POA: Diagnosis not present

## 2015-02-23 DIAGNOSIS — I10 Essential (primary) hypertension: Secondary | ICD-10-CM | POA: Diagnosis not present

## 2015-02-26 DIAGNOSIS — B029 Zoster without complications: Secondary | ICD-10-CM | POA: Diagnosis not present

## 2015-03-02 ENCOUNTER — Other Ambulatory Visit: Payer: Self-pay

## 2015-03-02 DIAGNOSIS — M150 Primary generalized (osteo)arthritis: Secondary | ICD-10-CM | POA: Diagnosis not present

## 2015-03-02 DIAGNOSIS — E782 Mixed hyperlipidemia: Secondary | ICD-10-CM | POA: Diagnosis not present

## 2015-03-02 DIAGNOSIS — I10 Essential (primary) hypertension: Secondary | ICD-10-CM | POA: Diagnosis not present

## 2015-03-02 DIAGNOSIS — F411 Generalized anxiety disorder: Secondary | ICD-10-CM | POA: Diagnosis not present

## 2015-04-06 DIAGNOSIS — H269 Unspecified cataract: Secondary | ICD-10-CM | POA: Diagnosis not present

## 2015-04-20 DIAGNOSIS — H2511 Age-related nuclear cataract, right eye: Secondary | ICD-10-CM | POA: Diagnosis not present

## 2015-04-20 DIAGNOSIS — H25811 Combined forms of age-related cataract, right eye: Secondary | ICD-10-CM | POA: Diagnosis not present

## 2015-05-18 DIAGNOSIS — H2513 Age-related nuclear cataract, bilateral: Secondary | ICD-10-CM | POA: Diagnosis not present

## 2015-05-18 DIAGNOSIS — H25812 Combined forms of age-related cataract, left eye: Secondary | ICD-10-CM | POA: Diagnosis not present

## 2015-05-18 DIAGNOSIS — H2512 Age-related nuclear cataract, left eye: Secondary | ICD-10-CM | POA: Diagnosis not present

## 2015-05-26 DIAGNOSIS — Z961 Presence of intraocular lens: Secondary | ICD-10-CM | POA: Diagnosis not present

## 2015-05-26 DIAGNOSIS — H25812 Combined forms of age-related cataract, left eye: Secondary | ICD-10-CM | POA: Diagnosis not present

## 2015-09-17 DIAGNOSIS — E782 Mixed hyperlipidemia: Secondary | ICD-10-CM | POA: Diagnosis not present

## 2015-09-17 DIAGNOSIS — I10 Essential (primary) hypertension: Secondary | ICD-10-CM | POA: Diagnosis not present

## 2015-09-17 DIAGNOSIS — M150 Primary generalized (osteo)arthritis: Secondary | ICD-10-CM | POA: Diagnosis not present

## 2015-09-17 DIAGNOSIS — E039 Hypothyroidism, unspecified: Secondary | ICD-10-CM | POA: Diagnosis not present

## 2015-09-21 DIAGNOSIS — M150 Primary generalized (osteo)arthritis: Secondary | ICD-10-CM | POA: Diagnosis not present

## 2015-09-21 DIAGNOSIS — I10 Essential (primary) hypertension: Secondary | ICD-10-CM | POA: Diagnosis not present

## 2015-09-21 DIAGNOSIS — K210 Gastro-esophageal reflux disease with esophagitis: Secondary | ICD-10-CM | POA: Diagnosis not present

## 2015-09-21 DIAGNOSIS — E782 Mixed hyperlipidemia: Secondary | ICD-10-CM | POA: Diagnosis not present

## 2015-09-30 DIAGNOSIS — K219 Gastro-esophageal reflux disease without esophagitis: Secondary | ICD-10-CM | POA: Diagnosis not present

## 2015-09-30 DIAGNOSIS — Q396 Congenital diverticulum of esophagus: Secondary | ICD-10-CM | POA: Diagnosis not present

## 2015-09-30 DIAGNOSIS — R933 Abnormal findings on diagnostic imaging of other parts of digestive tract: Secondary | ICD-10-CM | POA: Diagnosis not present

## 2015-11-20 DIAGNOSIS — H524 Presbyopia: Secondary | ICD-10-CM | POA: Diagnosis not present

## 2015-11-20 DIAGNOSIS — H35363 Drusen (degenerative) of macula, bilateral: Secondary | ICD-10-CM | POA: Diagnosis not present

## 2015-11-20 DIAGNOSIS — H5203 Hypermetropia, bilateral: Secondary | ICD-10-CM | POA: Diagnosis not present

## 2015-11-20 DIAGNOSIS — H26492 Other secondary cataract, left eye: Secondary | ICD-10-CM | POA: Diagnosis not present

## 2015-11-20 DIAGNOSIS — H52223 Regular astigmatism, bilateral: Secondary | ICD-10-CM | POA: Diagnosis not present

## 2015-11-20 DIAGNOSIS — H43813 Vitreous degeneration, bilateral: Secondary | ICD-10-CM | POA: Diagnosis not present

## 2015-11-20 DIAGNOSIS — Z961 Presence of intraocular lens: Secondary | ICD-10-CM | POA: Diagnosis not present

## 2016-02-15 DIAGNOSIS — N390 Urinary tract infection, site not specified: Secondary | ICD-10-CM | POA: Diagnosis not present

## 2016-02-15 DIAGNOSIS — E782 Mixed hyperlipidemia: Secondary | ICD-10-CM | POA: Diagnosis not present

## 2016-02-15 DIAGNOSIS — E039 Hypothyroidism, unspecified: Secondary | ICD-10-CM | POA: Diagnosis not present

## 2016-02-15 DIAGNOSIS — I10 Essential (primary) hypertension: Secondary | ICD-10-CM | POA: Diagnosis not present

## 2016-02-15 DIAGNOSIS — R7301 Impaired fasting glucose: Secondary | ICD-10-CM | POA: Diagnosis not present

## 2016-02-19 DIAGNOSIS — Z0000 Encounter for general adult medical examination without abnormal findings: Secondary | ICD-10-CM | POA: Diagnosis not present

## 2016-02-19 DIAGNOSIS — M810 Age-related osteoporosis without current pathological fracture: Secondary | ICD-10-CM | POA: Diagnosis not present

## 2016-02-19 DIAGNOSIS — Z780 Asymptomatic menopausal state: Secondary | ICD-10-CM | POA: Diagnosis not present

## 2016-02-22 DIAGNOSIS — Q396 Congenital diverticulum of esophagus: Secondary | ICD-10-CM | POA: Diagnosis not present

## 2016-02-22 DIAGNOSIS — E782 Mixed hyperlipidemia: Secondary | ICD-10-CM | POA: Diagnosis not present

## 2016-02-22 DIAGNOSIS — I10 Essential (primary) hypertension: Secondary | ICD-10-CM | POA: Diagnosis not present

## 2016-02-22 DIAGNOSIS — F411 Generalized anxiety disorder: Secondary | ICD-10-CM | POA: Diagnosis not present

## 2016-02-22 DIAGNOSIS — E039 Hypothyroidism, unspecified: Secondary | ICD-10-CM | POA: Diagnosis not present

## 2016-03-21 DIAGNOSIS — E1165 Type 2 diabetes mellitus with hyperglycemia: Secondary | ICD-10-CM | POA: Diagnosis not present

## 2016-03-21 DIAGNOSIS — F411 Generalized anxiety disorder: Secondary | ICD-10-CM | POA: Diagnosis not present

## 2016-03-21 DIAGNOSIS — Q396 Congenital diverticulum of esophagus: Secondary | ICD-10-CM | POA: Diagnosis not present

## 2016-03-21 DIAGNOSIS — M179 Osteoarthritis of knee, unspecified: Secondary | ICD-10-CM | POA: Diagnosis not present

## 2016-03-21 DIAGNOSIS — M349 Systemic sclerosis, unspecified: Secondary | ICD-10-CM | POA: Diagnosis not present

## 2016-03-21 DIAGNOSIS — Z6829 Body mass index (BMI) 29.0-29.9, adult: Secondary | ICD-10-CM | POA: Diagnosis not present

## 2016-03-21 DIAGNOSIS — E782 Mixed hyperlipidemia: Secondary | ICD-10-CM | POA: Diagnosis not present

## 2016-03-21 DIAGNOSIS — M25562 Pain in left knee: Secondary | ICD-10-CM | POA: Diagnosis not present

## 2016-03-21 DIAGNOSIS — R21 Rash and other nonspecific skin eruption: Secondary | ICD-10-CM | POA: Diagnosis not present

## 2016-03-21 DIAGNOSIS — N3000 Acute cystitis without hematuria: Secondary | ICD-10-CM | POA: Diagnosis not present

## 2016-03-21 DIAGNOSIS — Z720 Tobacco use: Secondary | ICD-10-CM | POA: Diagnosis not present

## 2016-03-21 DIAGNOSIS — I10 Essential (primary) hypertension: Secondary | ICD-10-CM | POA: Diagnosis not present

## 2016-03-28 DIAGNOSIS — Q396 Congenital diverticulum of esophagus: Secondary | ICD-10-CM | POA: Diagnosis not present

## 2016-03-28 DIAGNOSIS — M25562 Pain in left knee: Secondary | ICD-10-CM | POA: Diagnosis not present

## 2016-03-28 DIAGNOSIS — M25561 Pain in right knee: Secondary | ICD-10-CM | POA: Diagnosis not present

## 2016-03-28 DIAGNOSIS — E782 Mixed hyperlipidemia: Secondary | ICD-10-CM | POA: Diagnosis not present

## 2016-05-10 ENCOUNTER — Ambulatory Visit (HOSPITAL_COMMUNITY)
Admission: EM | Admit: 2016-05-10 | Discharge: 2016-05-10 | Disposition: A | Discharge status: Home / Self Care | Payer: Medicare Other | Attending: Family Medicine | Admitting: Family Medicine

## 2016-05-10 ENCOUNTER — Encounter (HOSPITAL_COMMUNITY): Payer: Self-pay | Admitting: Family Medicine

## 2016-05-10 ENCOUNTER — Ambulatory Visit (INDEPENDENT_AMBULATORY_CARE_PROVIDER_SITE_OTHER): Payer: Medicare Other

## 2016-05-10 DIAGNOSIS — S62396B Other fracture of fifth metacarpal bone, right hand, initial encounter for open fracture: Secondary | ICD-10-CM

## 2016-05-10 DIAGNOSIS — S62316A Displaced fracture of base of fifth metacarpal bone, right hand, initial encounter for closed fracture: Secondary | ICD-10-CM | POA: Diagnosis not present

## 2016-05-10 DIAGNOSIS — S61411A Laceration without foreign body of right hand, initial encounter: Secondary | ICD-10-CM

## 2016-05-10 DIAGNOSIS — M79641 Pain in right hand: Secondary | ICD-10-CM

## 2016-05-10 DIAGNOSIS — Z23 Encounter for immunization: Secondary | ICD-10-CM | POA: Diagnosis not present

## 2016-05-10 MED ORDER — TETANUS-DIPHTH-ACELL PERTUSSIS 5-2.5-18.5 LF-MCG/0.5 IM SUSP
0.5000 mL | Freq: Once | INTRAMUSCULAR | Status: AC
  Administered 2016-05-10: 0.5 mL via INTRAMUSCULAR

## 2016-05-10 MED ORDER — TETANUS-DIPHTH-ACELL PERTUSSIS 5-2.5-18.5 LF-MCG/0.5 IM SUSP
INTRAMUSCULAR | Status: AC
  Filled 2016-05-10: qty 0.5

## 2016-05-10 NOTE — Progress Notes (Signed)
Orthopedic Tech Progress Note Patient Details:  [facility]iam [facility] 03/07/1934 [id number]  Ortho Devices Type of Ortho Device: Ace wrap, Arm sling, Ulna gutter splint Ortho Device/Splint Location: RUE Ortho Device/Splint Interventions: Ordered, Application   Jennye MoccasinHughes, Armoni Depass Craig 05/10/2016, 5:33 PM

## 2016-05-10 NOTE — Discharge Instructions (Signed)
Please call Dr. Dominica SeverinWilliam Gramig tomorrow for a follow up appointment on Friday.

## 2016-05-10 NOTE — ED Triage Notes (Signed)
Pt here for fall today. sts she fell over some uneven pavement. Pt here with injury to right hand and wrist.

## 2016-05-10 NOTE — ED Notes (Signed)
Ortho at bedside.

## 2016-05-10 NOTE — ED Provider Notes (Addendum)
[facility]    CSN: [id number] Arrival date & time: 05/10/16  1611  First Provider Contact:  First MD Initiated Contact with Patient 05/10/16 1643        History   Chief Complaint Chief Complaint  Patient presents with  . Hand Injury    HPI Andrea Neal is a 82 y.o. female.   82 yo woman who fell injuring her right hand and wrist.  She was in her driveway when her toe caught in uneven H concrete she fell forward on her outstretched hand. She had immediate pain but is much more bearable now.      Past Medical History:  Diagnosis Date  . Arthritis   . Complication of anesthesia 2010   slow to awaken after back surgery at [place]  . GERD (gastroesophageal reflux disease)   . Hypertension    hx of high bp, takes diuretic now  . Thyroid disease     Patient Active Problem List   Diagnosis Date Noted  . Scleroderma (HCC) 02/20/2014  . Esophageal diverticular disease 01/23/2014    Past Surgical History:  Procedure Laterality Date  . ABDOMINAL HYSTERECTOMY  1980's  . APPENDECTOMY  1968   while having c-sec  . BILROTH I PROCEDURE  1982   FOR ULCER DISEASE  . CARPAL TUNNEL RELEASE  2011   rt hand  . CARPECTOMY    . CESAREAN SECTION  [id number]  . ESOPHAGEAL MANOMETRY N/A 02/10/2014   Procedure: ESOPHAGEAL MANOMETRY (EM);  Surgeon: [name], MD;  Location: [facility];  Service: Endoscopy;  Laterality: N/A;  . ESOPHAGOGASTRODUODENOSCOPY N/A 02/07/2014   Procedure: ESOPHAGOGASTRODUODENOSCOPY (EGD);  Surgeon: [name], MD;  Location: [facility];  Service: Endoscopy;  Laterality: N/A;  . HYSTEROTOMY  1990   tumor  . SPINE SURGERY  2010   4/5  . ulcers  1982   1/3 of stomach removed for ulcers    OB History    No data available       Home Medications    Prior to Admission medications   Medication Sig Start Date End Date Taking? Authorizing Provider  clonazePAM (KLONOPIN) 0.5 MG tablet Take 0.5 mg by mouth 2 (two) times daily as  needed for anxiety.    Historical Provider, MD  hydrochlorothiazide (HYDRODIURIL) 25 MG tablet Take 25 mg by mouth every morning.     Historical Provider, MD  levothyroxine (SYNTHROID, LEVOTHROID) 25 MCG tablet Take 25 mcg by mouth daily before breakfast.    Historical Provider, MD  MAGNESIUM PO Take 1 tablet by mouth daily.    Historical Provider, MD  Multiple Vitamin (MULTIVITAMIN WITH MINERALS) TABS tablet Take 1 tablet by mouth daily.    Historical Provider, MD  ranitidine (ZANTAC) 300 MG tablet Take 300 mg by mouth 2 (two) times daily.    Historical Provider, MD  traZODone (DESYREL) 100 MG tablet Take 100 mg by mouth at bedtime as needed for sleep.     Historical Provider, MD    Family History Family History  Problem Relation Age of Onset  . Stroke Mother   . Heart disease Father     Social History Social History  Substance Use Topics  . Smoking status: Former Smoker    Packs/day: 0.25    Years: 10.00    Quit date: 01/24/1984  . Smokeless tobacco: Never Used  . Alcohol use No     Allergies   Aspirin; Codeine; Dilaudid [hydromorphone hcl]; Fentanyl; Morphine and related; Oxycodone; Penicillins;  Ultram [tramadol]; Vioxx [rofecoxib]; Sulfur; and Vicodin [hydrocodone-acetaminophen]   Review of Systems Review of Systems  Constitutional: Negative.   HENT: Negative.   Eyes: Negative.   Respiratory: Negative.   Neurological: Negative.      Physical Exam Triage Vital Signs ED Triage Vitals  Enc Vitals Group     BP 05/10/16 1630 141/81     Pulse Rate 05/10/16 1630 87     Resp 05/10/16 1630 18     Temp 05/10/16 1630 97.9 F (36.6 C)     Temp src --      SpO2 05/10/16 1630 95 %     Weight --      Height --      Head Circumference --      Peak Flow --      Pain Score 05/10/16 1632 5     Pain Loc --      Pain Edu? --      Excl. in GC? --    No data found.   Updated Vital Signs BP 141/81   Pulse 87   Temp 97.9 F (36.6 C)   Resp 18   SpO2 95%   Visual  Acuity    Physical Exam  Constitutional: She appears well-developed and well-nourished.  HENT:  Head: Normocephalic and atraumatic.  Eyes: Conjunctivae are normal. Pupils are equal, round, and reactive to light.  Neck: Normal range of motion. Neck supple.  Nursing note and vitals reviewed.  Right hand: Patient has a 1 cm irregular laceration at the base of the volar fifth (pinky) digit. She has difficulty flexing her finger because of pain. There is mild swelling at the base of the pinky finger  UC Treatments / Results  Labs (all labs ordered are listed, but only abnormal results are displayed) Labs Reviewed - No data to display  EKG  EKG Interpretation None       Radiology Dg Hand Complete Right  Result Date: 05/10/2016 CLINICAL DATA:  Fall, RIGHT-sided injury. EXAM: RIGHT HAND - COMPLETE 3+ VIEW COMPARISON:  None. FINDINGS: Cortical discontinuity at the base of the fifth metacarpal. No carpal fracture. No dislocation. IMPRESSION: Fractured at the base the fifth metacarpal. Electronically Signed   By: Genevive BiStewart  Edmunds M.D.   On: 05/10/2016 16:53    Procedures .Marland Kitchen.Laceration Repair Date/Time: 05/10/2016 5:04 PM Performed by: Elvina SidleLAUENSTEIN, Tejasvi Brissett Authorized by: Elvina SidleLAUENSTEIN, Lileigh Fahringer   Consent:    Consent obtained:  Verbal   Consent given by:  Patient   Risks discussed:  Infection, pain and need for additional repair   Alternatives discussed:  No treatment Anesthesia (see MAR for exact dosages):    Anesthesia method:  None Laceration details:    Location:  Hand   Hand location:  R palm Repair type:    Repair type:  Simple Exploration:    Contaminated: no   Treatment:    Area cleansed with:  Soap and water   Amount of cleaning:  Standard   Irrigation solution:  Tap water   Irrigation method:  Tap   Visualized foreign bodies/material removed: no   Skin repair:    Repair method:  Tissue adhesive Approximation:    Approximation:  Close   Vermilion border: well-aligned     Post-procedure details:    Dressing:  Non-adherent dressing   Patient tolerance of procedure:  Tolerated well, no immediate complications    (including critical care time)  Medications Ordered in UC Medications  Tdap (BOOSTRIX) injection 0.5 mL (not administered)  Initial Impression / Assessment and Plan / UC Course  I have reviewed the triage vital signs and the nursing notes.  Pertinent labs & imaging results that were available during my care of the patient were reviewed by me and considered in my medical decision making (see chart for details).  Clinical Course   The lower wound was washed with soap and water and closed with Dermabond. Orthopedic tech was contacted and sugar tong splint was applied. Patient was instructed call Dr. Amanda Pea for appointment in 3 days.   Final Clinical Impressions(s) / UC Diagnoses   Final diagnoses:  Right hand pain  Other fracture of fifth metacarpal bone, right hand, initial encounter for open fracture    New Prescriptions New Prescriptions   No medications on file  Follow-up with Dr. Fabio Asa, MD 05/10/16 1719    Elvina Sidle, MD 05/10/16 [phone]

## 2016-05-12 DIAGNOSIS — S62344A Nondisplaced fracture of base of fourth metacarpal bone, right hand, initial encounter for closed fracture: Secondary | ICD-10-CM | POA: Diagnosis not present

## 2016-05-12 DIAGNOSIS — S62346A Nondisplaced fracture of base of fifth metacarpal bone, right hand, initial encounter for closed fracture: Secondary | ICD-10-CM | POA: Diagnosis not present

## 2016-05-12 DIAGNOSIS — M79641 Pain in right hand: Secondary | ICD-10-CM | POA: Diagnosis not present

## 2016-05-25 DIAGNOSIS — S62344D Nondisplaced fracture of base of fourth metacarpal bone, right hand, subsequent encounter for fracture with routine healing: Secondary | ICD-10-CM | POA: Diagnosis not present

## 2016-05-25 DIAGNOSIS — S62346D Nondisplaced fracture of base of fifth metacarpal bone, right hand, subsequent encounter for fracture with routine healing: Secondary | ICD-10-CM | POA: Diagnosis not present

## 2016-06-08 DIAGNOSIS — S62344D Nondisplaced fracture of base of fourth metacarpal bone, right hand, subsequent encounter for fracture with routine healing: Secondary | ICD-10-CM | POA: Diagnosis not present

## 2016-06-08 DIAGNOSIS — S62346D Nondisplaced fracture of base of fifth metacarpal bone, right hand, subsequent encounter for fracture with routine healing: Secondary | ICD-10-CM | POA: Diagnosis not present

## 2016-06-23 DIAGNOSIS — S62346D Nondisplaced fracture of base of fifth metacarpal bone, right hand, subsequent encounter for fracture with routine healing: Secondary | ICD-10-CM | POA: Diagnosis not present

## 2016-06-23 DIAGNOSIS — S62344D Nondisplaced fracture of base of fourth metacarpal bone, right hand, subsequent encounter for fracture with routine healing: Secondary | ICD-10-CM | POA: Diagnosis not present

## 2016-06-29 DIAGNOSIS — Z23 Encounter for immunization: Secondary | ICD-10-CM | POA: Diagnosis not present

## 2016-09-12 DIAGNOSIS — I10 Essential (primary) hypertension: Secondary | ICD-10-CM | POA: Diagnosis not present

## 2016-09-12 DIAGNOSIS — E782 Mixed hyperlipidemia: Secondary | ICD-10-CM | POA: Diagnosis not present

## 2016-09-12 DIAGNOSIS — R7301 Impaired fasting glucose: Secondary | ICD-10-CM | POA: Diagnosis not present

## 2016-09-19 DIAGNOSIS — E782 Mixed hyperlipidemia: Secondary | ICD-10-CM | POA: Diagnosis not present

## 2016-09-19 DIAGNOSIS — F411 Generalized anxiety disorder: Secondary | ICD-10-CM | POA: Diagnosis not present

## 2016-09-19 DIAGNOSIS — Q396 Congenital diverticulum of esophagus: Secondary | ICD-10-CM | POA: Diagnosis not present

## 2016-09-19 DIAGNOSIS — R7301 Impaired fasting glucose: Secondary | ICD-10-CM | POA: Diagnosis not present

## 2017-01-17 DIAGNOSIS — M79671 Pain in right foot: Secondary | ICD-10-CM | POA: Diagnosis not present

## 2017-01-17 DIAGNOSIS — S99921A Unspecified injury of right foot, initial encounter: Secondary | ICD-10-CM | POA: Diagnosis not present

## 2017-01-31 DIAGNOSIS — M25474 Effusion, right foot: Secondary | ICD-10-CM | POA: Diagnosis not present

## 2017-01-31 DIAGNOSIS — M79671 Pain in right foot: Secondary | ICD-10-CM | POA: Diagnosis not present

## 2017-02-20 DIAGNOSIS — Z0000 Encounter for general adult medical examination without abnormal findings: Secondary | ICD-10-CM | POA: Diagnosis not present

## 2017-02-20 DIAGNOSIS — E039 Hypothyroidism, unspecified: Secondary | ICD-10-CM | POA: Diagnosis not present

## 2017-02-20 DIAGNOSIS — I10 Essential (primary) hypertension: Secondary | ICD-10-CM | POA: Diagnosis not present

## 2017-02-20 DIAGNOSIS — E782 Mixed hyperlipidemia: Secondary | ICD-10-CM | POA: Diagnosis not present

## 2017-02-27 DIAGNOSIS — R002 Palpitations: Secondary | ICD-10-CM | POA: Diagnosis not present

## 2017-02-27 DIAGNOSIS — M150 Primary generalized (osteo)arthritis: Secondary | ICD-10-CM | POA: Diagnosis not present

## 2017-02-27 DIAGNOSIS — R7301 Impaired fasting glucose: Secondary | ICD-10-CM | POA: Diagnosis not present

## 2017-02-27 DIAGNOSIS — E782 Mixed hyperlipidemia: Secondary | ICD-10-CM | POA: Diagnosis not present

## 2017-02-27 DIAGNOSIS — E039 Hypothyroidism, unspecified: Secondary | ICD-10-CM | POA: Diagnosis not present

## 2017-03-22 DIAGNOSIS — M9902 Segmental and somatic dysfunction of thoracic region: Secondary | ICD-10-CM | POA: Diagnosis not present

## 2017-03-22 DIAGNOSIS — M47816 Spondylosis without myelopathy or radiculopathy, lumbar region: Secondary | ICD-10-CM | POA: Diagnosis not present

## 2017-03-22 DIAGNOSIS — M47814 Spondylosis without myelopathy or radiculopathy, thoracic region: Secondary | ICD-10-CM | POA: Diagnosis not present

## 2017-03-22 DIAGNOSIS — M9903 Segmental and somatic dysfunction of lumbar region: Secondary | ICD-10-CM | POA: Diagnosis not present

## 2017-03-23 DIAGNOSIS — M47814 Spondylosis without myelopathy or radiculopathy, thoracic region: Secondary | ICD-10-CM | POA: Diagnosis not present

## 2017-03-23 DIAGNOSIS — M47816 Spondylosis without myelopathy or radiculopathy, lumbar region: Secondary | ICD-10-CM | POA: Diagnosis not present

## 2017-03-23 DIAGNOSIS — M9903 Segmental and somatic dysfunction of lumbar region: Secondary | ICD-10-CM | POA: Diagnosis not present

## 2017-03-23 DIAGNOSIS — M9902 Segmental and somatic dysfunction of thoracic region: Secondary | ICD-10-CM | POA: Diagnosis not present

## 2017-03-27 DIAGNOSIS — M47814 Spondylosis without myelopathy or radiculopathy, thoracic region: Secondary | ICD-10-CM | POA: Diagnosis not present

## 2017-03-27 DIAGNOSIS — M47816 Spondylosis without myelopathy or radiculopathy, lumbar region: Secondary | ICD-10-CM | POA: Diagnosis not present

## 2017-03-27 DIAGNOSIS — M9902 Segmental and somatic dysfunction of thoracic region: Secondary | ICD-10-CM | POA: Diagnosis not present

## 2017-03-27 DIAGNOSIS — M9903 Segmental and somatic dysfunction of lumbar region: Secondary | ICD-10-CM | POA: Diagnosis not present

## 2017-03-28 DIAGNOSIS — M47816 Spondylosis without myelopathy or radiculopathy, lumbar region: Secondary | ICD-10-CM | POA: Diagnosis not present

## 2017-03-28 DIAGNOSIS — M9903 Segmental and somatic dysfunction of lumbar region: Secondary | ICD-10-CM | POA: Diagnosis not present

## 2017-03-28 DIAGNOSIS — M47814 Spondylosis without myelopathy or radiculopathy, thoracic region: Secondary | ICD-10-CM | POA: Diagnosis not present

## 2017-03-28 DIAGNOSIS — M9902 Segmental and somatic dysfunction of thoracic region: Secondary | ICD-10-CM | POA: Diagnosis not present

## 2017-03-29 DIAGNOSIS — M9903 Segmental and somatic dysfunction of lumbar region: Secondary | ICD-10-CM | POA: Diagnosis not present

## 2017-03-29 DIAGNOSIS — M47814 Spondylosis without myelopathy or radiculopathy, thoracic region: Secondary | ICD-10-CM | POA: Diagnosis not present

## 2017-03-29 DIAGNOSIS — M47816 Spondylosis without myelopathy or radiculopathy, lumbar region: Secondary | ICD-10-CM | POA: Diagnosis not present

## 2017-03-29 DIAGNOSIS — M9902 Segmental and somatic dysfunction of thoracic region: Secondary | ICD-10-CM | POA: Diagnosis not present

## 2017-04-03 DIAGNOSIS — M9903 Segmental and somatic dysfunction of lumbar region: Secondary | ICD-10-CM | POA: Diagnosis not present

## 2017-04-03 DIAGNOSIS — M9902 Segmental and somatic dysfunction of thoracic region: Secondary | ICD-10-CM | POA: Diagnosis not present

## 2017-04-03 DIAGNOSIS — M47816 Spondylosis without myelopathy or radiculopathy, lumbar region: Secondary | ICD-10-CM | POA: Diagnosis not present

## 2017-04-03 DIAGNOSIS — M47814 Spondylosis without myelopathy or radiculopathy, thoracic region: Secondary | ICD-10-CM | POA: Diagnosis not present

## 2017-04-04 DIAGNOSIS — M47814 Spondylosis without myelopathy or radiculopathy, thoracic region: Secondary | ICD-10-CM | POA: Diagnosis not present

## 2017-04-04 DIAGNOSIS — M47816 Spondylosis without myelopathy or radiculopathy, lumbar region: Secondary | ICD-10-CM | POA: Diagnosis not present

## 2017-04-04 DIAGNOSIS — M9903 Segmental and somatic dysfunction of lumbar region: Secondary | ICD-10-CM | POA: Diagnosis not present

## 2017-04-04 DIAGNOSIS — M9902 Segmental and somatic dysfunction of thoracic region: Secondary | ICD-10-CM | POA: Diagnosis not present

## 2017-04-05 DIAGNOSIS — M9903 Segmental and somatic dysfunction of lumbar region: Secondary | ICD-10-CM | POA: Diagnosis not present

## 2017-04-05 DIAGNOSIS — M9902 Segmental and somatic dysfunction of thoracic region: Secondary | ICD-10-CM | POA: Diagnosis not present

## 2017-04-05 DIAGNOSIS — M47816 Spondylosis without myelopathy or radiculopathy, lumbar region: Secondary | ICD-10-CM | POA: Diagnosis not present

## 2017-04-05 DIAGNOSIS — M47814 Spondylosis without myelopathy or radiculopathy, thoracic region: Secondary | ICD-10-CM | POA: Diagnosis not present

## 2017-04-10 DIAGNOSIS — M47816 Spondylosis without myelopathy or radiculopathy, lumbar region: Secondary | ICD-10-CM | POA: Diagnosis not present

## 2017-04-10 DIAGNOSIS — M9903 Segmental and somatic dysfunction of lumbar region: Secondary | ICD-10-CM | POA: Diagnosis not present

## 2017-04-10 DIAGNOSIS — M9902 Segmental and somatic dysfunction of thoracic region: Secondary | ICD-10-CM | POA: Diagnosis not present

## 2017-04-10 DIAGNOSIS — M47814 Spondylosis without myelopathy or radiculopathy, thoracic region: Secondary | ICD-10-CM | POA: Diagnosis not present

## 2017-04-11 DIAGNOSIS — M9902 Segmental and somatic dysfunction of thoracic region: Secondary | ICD-10-CM | POA: Diagnosis not present

## 2017-04-11 DIAGNOSIS — M9903 Segmental and somatic dysfunction of lumbar region: Secondary | ICD-10-CM | POA: Diagnosis not present

## 2017-04-11 DIAGNOSIS — M47814 Spondylosis without myelopathy or radiculopathy, thoracic region: Secondary | ICD-10-CM | POA: Diagnosis not present

## 2017-04-11 DIAGNOSIS — M47816 Spondylosis without myelopathy or radiculopathy, lumbar region: Secondary | ICD-10-CM | POA: Diagnosis not present

## 2017-04-12 DIAGNOSIS — M47816 Spondylosis without myelopathy or radiculopathy, lumbar region: Secondary | ICD-10-CM | POA: Diagnosis not present

## 2017-04-12 DIAGNOSIS — M47814 Spondylosis without myelopathy or radiculopathy, thoracic region: Secondary | ICD-10-CM | POA: Diagnosis not present

## 2017-04-12 DIAGNOSIS — M9903 Segmental and somatic dysfunction of lumbar region: Secondary | ICD-10-CM | POA: Diagnosis not present

## 2017-04-12 DIAGNOSIS — M9902 Segmental and somatic dysfunction of thoracic region: Secondary | ICD-10-CM | POA: Diagnosis not present

## 2017-04-17 DIAGNOSIS — M9903 Segmental and somatic dysfunction of lumbar region: Secondary | ICD-10-CM | POA: Diagnosis not present

## 2017-04-17 DIAGNOSIS — M9902 Segmental and somatic dysfunction of thoracic region: Secondary | ICD-10-CM | POA: Diagnosis not present

## 2017-04-17 DIAGNOSIS — M47816 Spondylosis without myelopathy or radiculopathy, lumbar region: Secondary | ICD-10-CM | POA: Diagnosis not present

## 2017-04-17 DIAGNOSIS — M47814 Spondylosis without myelopathy or radiculopathy, thoracic region: Secondary | ICD-10-CM | POA: Diagnosis not present

## 2017-04-19 DIAGNOSIS — M9902 Segmental and somatic dysfunction of thoracic region: Secondary | ICD-10-CM | POA: Diagnosis not present

## 2017-04-19 DIAGNOSIS — M47816 Spondylosis without myelopathy or radiculopathy, lumbar region: Secondary | ICD-10-CM | POA: Diagnosis not present

## 2017-04-19 DIAGNOSIS — M9903 Segmental and somatic dysfunction of lumbar region: Secondary | ICD-10-CM | POA: Diagnosis not present

## 2017-04-19 DIAGNOSIS — M47814 Spondylosis without myelopathy or radiculopathy, thoracic region: Secondary | ICD-10-CM | POA: Diagnosis not present

## 2017-04-25 DIAGNOSIS — Q396 Congenital diverticulum of esophagus: Secondary | ICD-10-CM | POA: Diagnosis not present

## 2017-04-25 DIAGNOSIS — R918 Other nonspecific abnormal finding of lung field: Secondary | ICD-10-CM | POA: Diagnosis not present

## 2017-04-25 DIAGNOSIS — Z87891 Personal history of nicotine dependence: Secondary | ICD-10-CM | POA: Diagnosis not present

## 2017-04-25 DIAGNOSIS — K228 Other specified diseases of esophagus: Secondary | ICD-10-CM | POA: Diagnosis not present

## 2017-04-25 DIAGNOSIS — M349 Systemic sclerosis, unspecified: Secondary | ICD-10-CM | POA: Diagnosis not present

## 2017-05-23 DIAGNOSIS — Q396 Congenital diverticulum of esophagus: Secondary | ICD-10-CM | POA: Diagnosis not present

## 2017-05-23 DIAGNOSIS — E039 Hypothyroidism, unspecified: Secondary | ICD-10-CM | POA: Diagnosis not present

## 2017-05-31 DIAGNOSIS — Z5331 Laparoscopic surgical procedure converted to open procedure: Secondary | ICD-10-CM | POA: Diagnosis not present

## 2017-05-31 DIAGNOSIS — Q396 Congenital diverticulum of esophagus: Secondary | ICD-10-CM | POA: Diagnosis not present

## 2017-05-31 DIAGNOSIS — Z8711 Personal history of peptic ulcer disease: Secondary | ICD-10-CM | POA: Diagnosis not present

## 2017-05-31 DIAGNOSIS — M349 Systemic sclerosis, unspecified: Secondary | ICD-10-CM | POA: Diagnosis present

## 2017-05-31 DIAGNOSIS — Z90710 Acquired absence of both cervix and uterus: Secondary | ICD-10-CM | POA: Diagnosis not present

## 2017-05-31 DIAGNOSIS — Z9049 Acquired absence of other specified parts of digestive tract: Secondary | ICD-10-CM | POA: Diagnosis not present

## 2017-05-31 DIAGNOSIS — Z87891 Personal history of nicotine dependence: Secondary | ICD-10-CM | POA: Diagnosis not present

## 2017-05-31 DIAGNOSIS — R1310 Dysphagia, unspecified: Secondary | ICD-10-CM | POA: Diagnosis not present

## 2017-05-31 DIAGNOSIS — Z880 Allergy status to penicillin: Secondary | ICD-10-CM | POA: Diagnosis not present

## 2017-05-31 DIAGNOSIS — K660 Peritoneal adhesions (postprocedural) (postinfection): Secondary | ICD-10-CM | POA: Diagnosis present

## 2017-05-31 DIAGNOSIS — E876 Hypokalemia: Secondary | ICD-10-CM | POA: Diagnosis present

## 2017-05-31 DIAGNOSIS — J939 Pneumothorax, unspecified: Secondary | ICD-10-CM | POA: Diagnosis not present

## 2017-05-31 DIAGNOSIS — Z23 Encounter for immunization: Secondary | ICD-10-CM | POA: Diagnosis not present

## 2017-05-31 DIAGNOSIS — R262 Difficulty in walking, not elsewhere classified: Secondary | ICD-10-CM | POA: Diagnosis not present

## 2017-05-31 DIAGNOSIS — K225 Diverticulum of esophagus, acquired: Secondary | ICD-10-CM | POA: Diagnosis not present

## 2017-05-31 DIAGNOSIS — J90 Pleural effusion, not elsewhere classified: Secondary | ICD-10-CM | POA: Diagnosis not present

## 2017-05-31 DIAGNOSIS — G8912 Acute post-thoracotomy pain: Secondary | ICD-10-CM | POA: Diagnosis not present

## 2017-05-31 DIAGNOSIS — E039 Hypothyroidism, unspecified: Secondary | ICD-10-CM | POA: Diagnosis present

## 2017-05-31 DIAGNOSIS — Z882 Allergy status to sulfonamides status: Secondary | ICD-10-CM | POA: Diagnosis not present

## 2017-05-31 DIAGNOSIS — Z4682 Encounter for fitting and adjustment of non-vascular catheter: Secondary | ICD-10-CM | POA: Diagnosis not present

## 2017-05-31 DIAGNOSIS — K219 Gastro-esophageal reflux disease without esophagitis: Secondary | ICD-10-CM | POA: Diagnosis present

## 2017-05-31 DIAGNOSIS — J948 Other specified pleural conditions: Secondary | ICD-10-CM | POA: Diagnosis not present

## 2017-05-31 DIAGNOSIS — R918 Other nonspecific abnormal finding of lung field: Secondary | ICD-10-CM | POA: Diagnosis not present

## 2017-05-31 DIAGNOSIS — J9811 Atelectasis: Secondary | ICD-10-CM | POA: Diagnosis not present

## 2017-06-20 DIAGNOSIS — Q396 Congenital diverticulum of esophagus: Secondary | ICD-10-CM | POA: Diagnosis not present

## 2017-06-20 DIAGNOSIS — R918 Other nonspecific abnormal finding of lung field: Secondary | ICD-10-CM | POA: Diagnosis not present

## 2017-06-20 DIAGNOSIS — R1310 Dysphagia, unspecified: Secondary | ICD-10-CM | POA: Diagnosis not present

## 2017-06-20 DIAGNOSIS — Z87891 Personal history of nicotine dependence: Secondary | ICD-10-CM | POA: Diagnosis not present

## 2017-06-20 DIAGNOSIS — R05 Cough: Secondary | ICD-10-CM | POA: Diagnosis not present

## 2017-06-20 DIAGNOSIS — J90 Pleural effusion, not elsewhere classified: Secondary | ICD-10-CM | POA: Diagnosis not present

## 2017-06-29 DIAGNOSIS — E782 Mixed hyperlipidemia: Secondary | ICD-10-CM | POA: Diagnosis not present

## 2017-06-29 DIAGNOSIS — K225 Diverticulum of esophagus, acquired: Secondary | ICD-10-CM | POA: Diagnosis not present

## 2017-06-29 DIAGNOSIS — R0782 Intercostal pain: Secondary | ICD-10-CM | POA: Diagnosis not present

## 2017-07-24 DIAGNOSIS — Z961 Presence of intraocular lens: Secondary | ICD-10-CM | POA: Diagnosis not present

## 2017-07-24 DIAGNOSIS — R51 Headache: Secondary | ICD-10-CM | POA: Diagnosis not present

## 2017-07-31 ENCOUNTER — Other Ambulatory Visit: Payer: Self-pay | Admitting: Internal Medicine

## 2017-07-31 DIAGNOSIS — R51 Headache: Secondary | ICD-10-CM | POA: Diagnosis not present

## 2017-07-31 DIAGNOSIS — R110 Nausea: Secondary | ICD-10-CM

## 2017-07-31 DIAGNOSIS — G44309 Post-traumatic headache, unspecified, not intractable: Secondary | ICD-10-CM

## 2017-07-31 DIAGNOSIS — G8918 Other acute postprocedural pain: Secondary | ICD-10-CM | POA: Diagnosis not present

## 2017-07-31 DIAGNOSIS — S0990XS Unspecified injury of head, sequela: Principal | ICD-10-CM

## 2017-08-01 ENCOUNTER — Ambulatory Visit
Admission: RE | Admit: 2017-08-01 | Discharge: 2017-08-01 | Disposition: A | Discharge status: Home / Self Care | Payer: Medicare Other | Source: Ambulatory Visit | Attending: Internal Medicine | Admitting: Internal Medicine

## 2017-08-01 DIAGNOSIS — G44309 Post-traumatic headache, unspecified, not intractable: Secondary | ICD-10-CM

## 2017-08-01 DIAGNOSIS — S0990XS Unspecified injury of head, sequela: Principal | ICD-10-CM

## 2017-08-01 DIAGNOSIS — R110 Nausea: Secondary | ICD-10-CM

## 2017-08-01 DIAGNOSIS — R51 Headache: Secondary | ICD-10-CM | POA: Diagnosis not present

## 2017-08-01 DIAGNOSIS — S0990XA Unspecified injury of head, initial encounter: Secondary | ICD-10-CM | POA: Diagnosis not present

## 2017-09-25 DIAGNOSIS — E039 Hypothyroidism, unspecified: Secondary | ICD-10-CM | POA: Diagnosis not present

## 2017-09-25 DIAGNOSIS — E782 Mixed hyperlipidemia: Secondary | ICD-10-CM | POA: Diagnosis not present

## 2017-09-25 DIAGNOSIS — R7301 Impaired fasting glucose: Secondary | ICD-10-CM | POA: Diagnosis not present

## 2017-10-02 DIAGNOSIS — E039 Hypothyroidism, unspecified: Secondary | ICD-10-CM | POA: Diagnosis not present

## 2017-10-02 DIAGNOSIS — M150 Primary generalized (osteo)arthritis: Secondary | ICD-10-CM | POA: Diagnosis not present

## 2017-10-02 DIAGNOSIS — E782 Mixed hyperlipidemia: Secondary | ICD-10-CM | POA: Diagnosis not present

## 2017-10-02 DIAGNOSIS — R7301 Impaired fasting glucose: Secondary | ICD-10-CM | POA: Diagnosis not present

## 2017-10-02 DIAGNOSIS — I10 Essential (primary) hypertension: Secondary | ICD-10-CM | POA: Diagnosis not present

## 2018-02-08 IMAGING — CT CT HEAD W/O CM
4 series · 17 of 47 positions shown, 19 images · non-contrast
Comparison: None.

CLINICAL DATA: Fall from ladder 07/05/2017. Hit right side of head.
Continued headache.

EXAM:
CT HEAD WITHOUT CONTRAST
TECHNIQUE: Contiguous axial images were obtained from the base of the skull
through the vertex without intravenous contrast.

[Series 3: head bone · axial · 0.49mm/px · z∈[-39,-4]mm · 3 of 69 slices shown]
[im 8/69  bone]
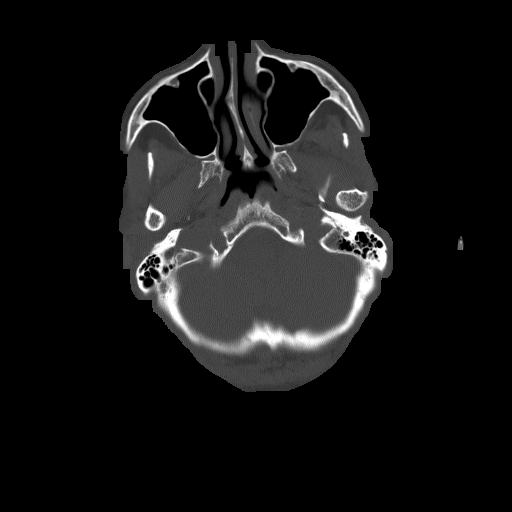
[im 15/69  bone]
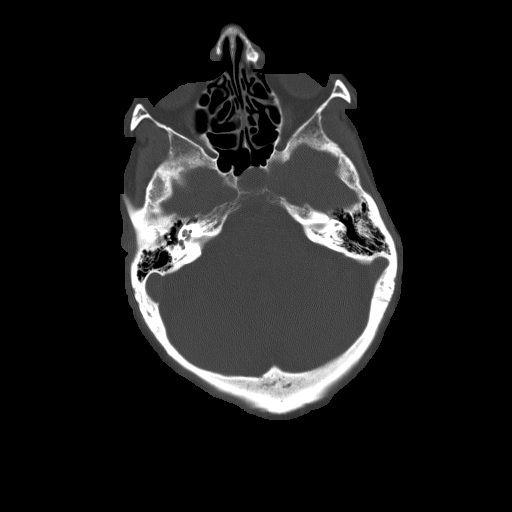
[im 22/69  bone]
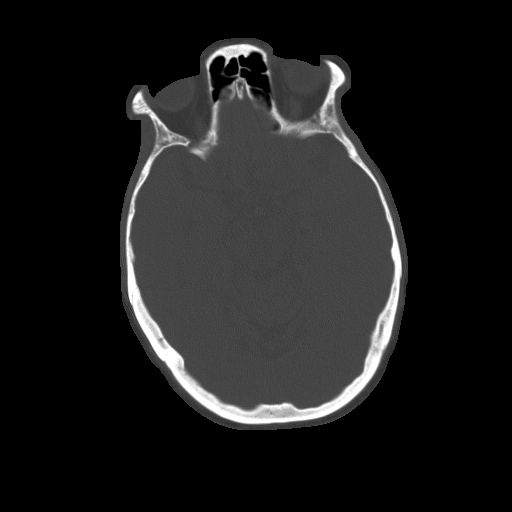

[Series 32: 3d filtered head w/o · axial · non-contrast · 0.49mm/px · z∈[-42,+94]mm · 8 of 35 slices shown, 10 images]
[im 4/35  brain]
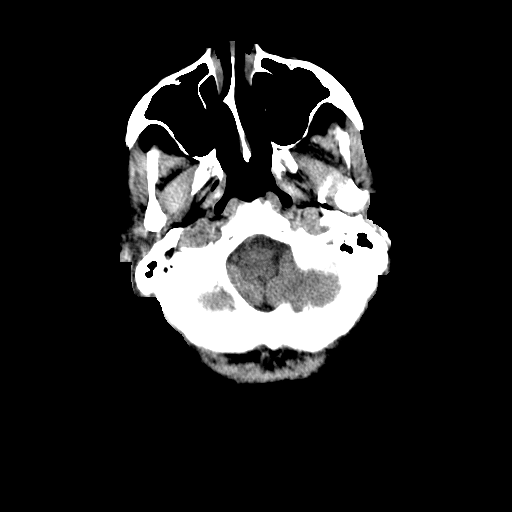
[im 4/35  bone]
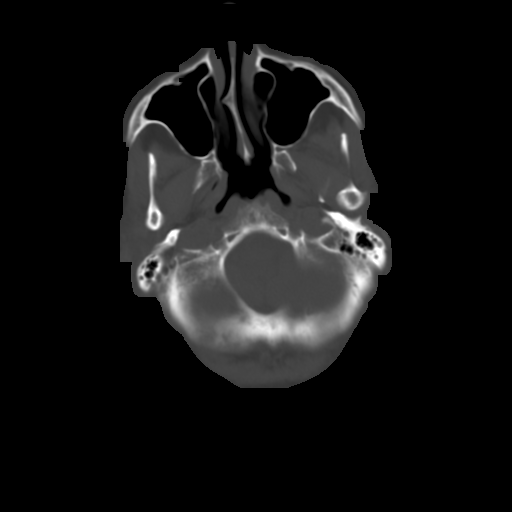
[im 8/35  brain]
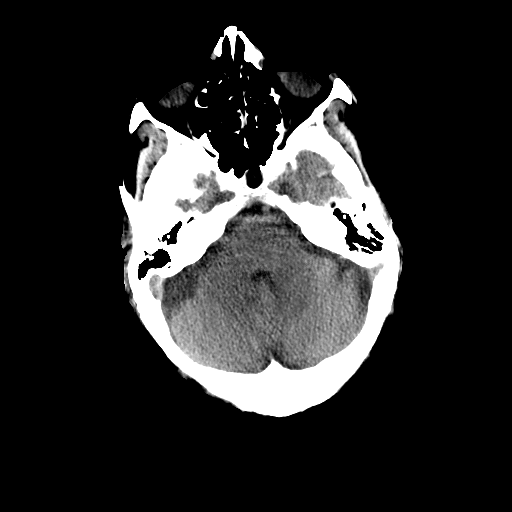
[im 12/35  brain]
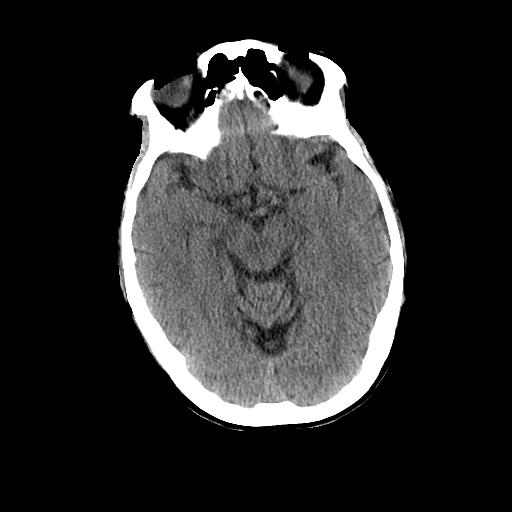
[im 16/35  brain]
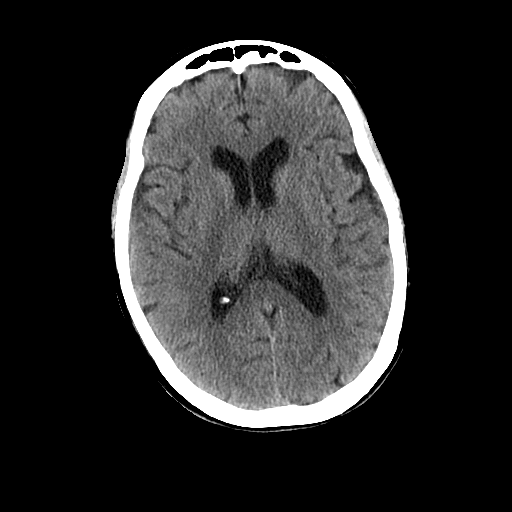
[im 19/35  brain]
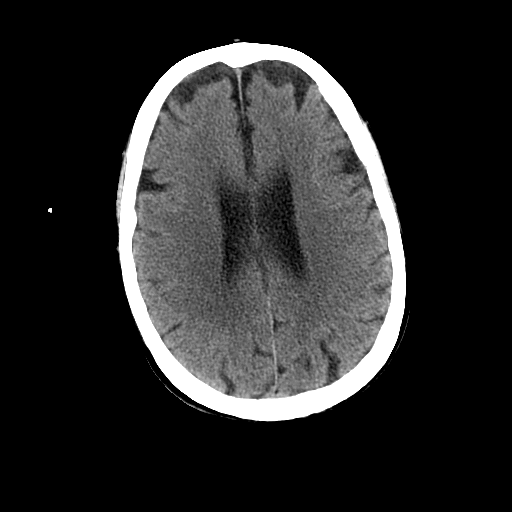
[im 19/35  bone]
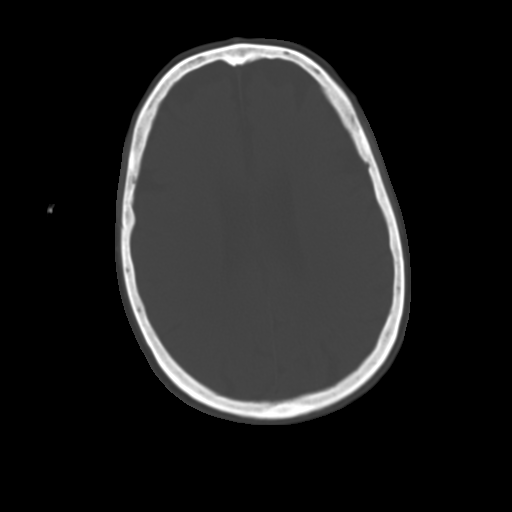
[im 23/35  brain]
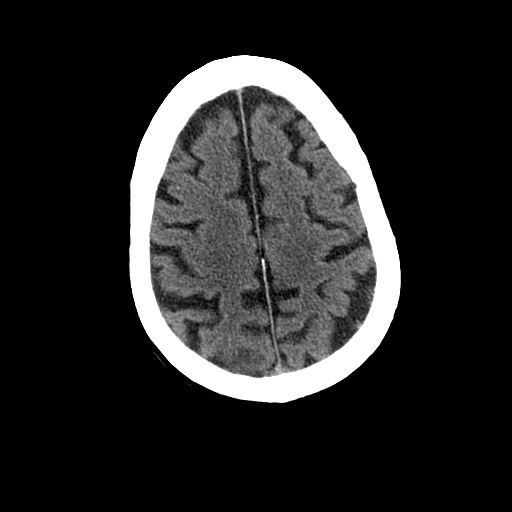
[im 27/35  brain]
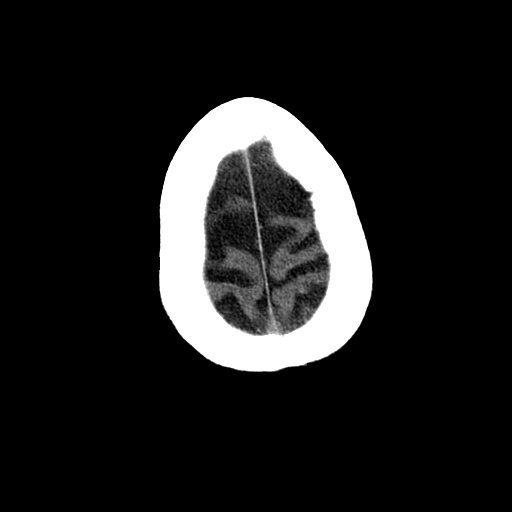
[im 31/35  brain]
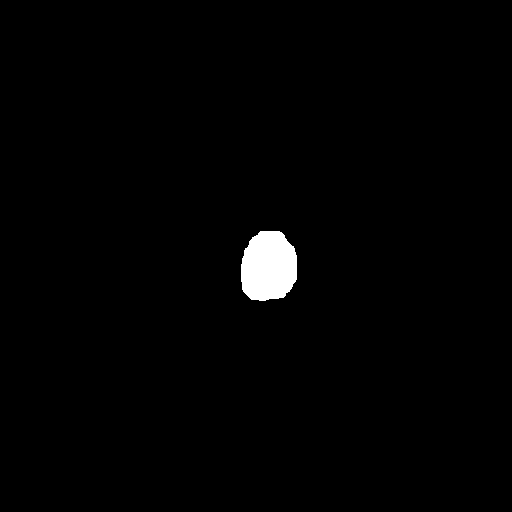

[Series 601: coronal brain · coronal · 0.49mm/px · 3 of 69 slices shown]
[im 23/69  brain]
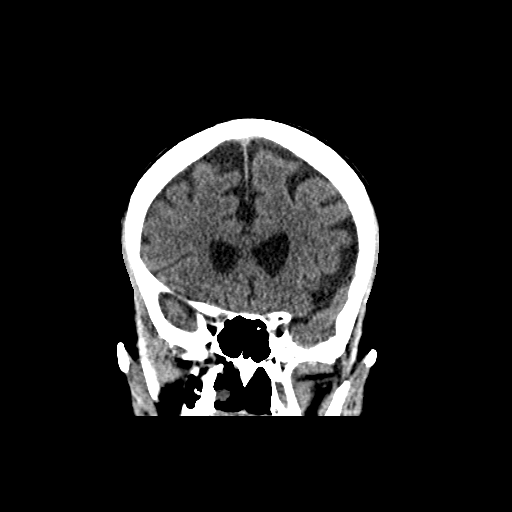
[im 31/69  brain]
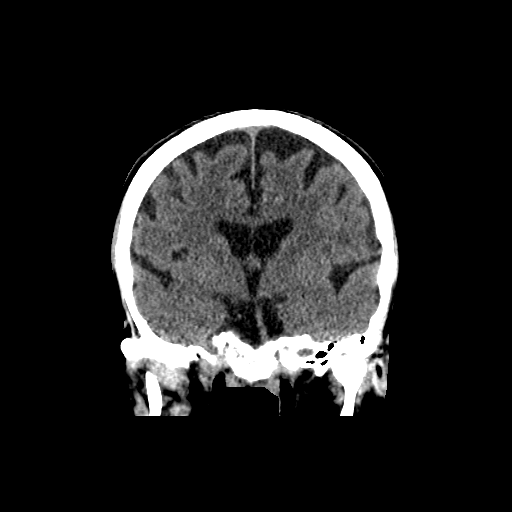
[im 38/69  brain]
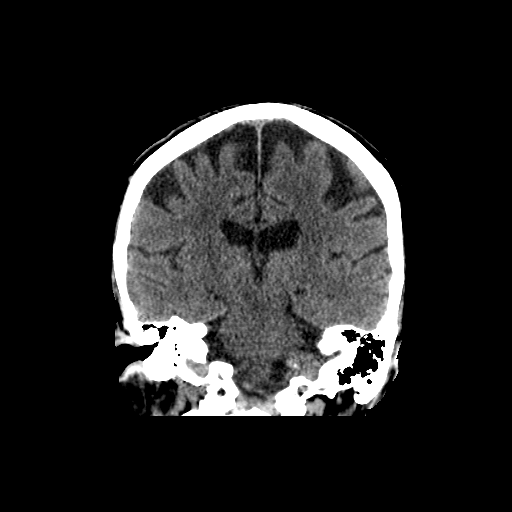

[Series 602: sagittal brain · sagittal · 0.49mm/px · 3 of 55 slices shown]
[im 19/55  brain]
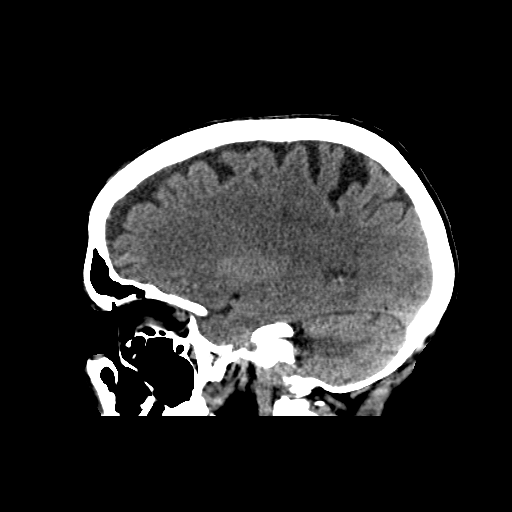
[im 28/55  brain]
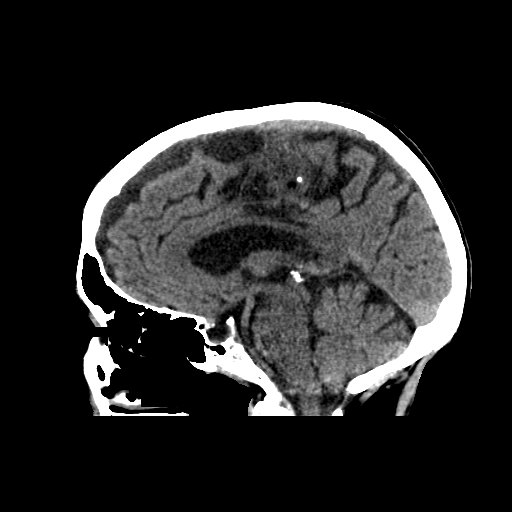
[im 37/55  brain]
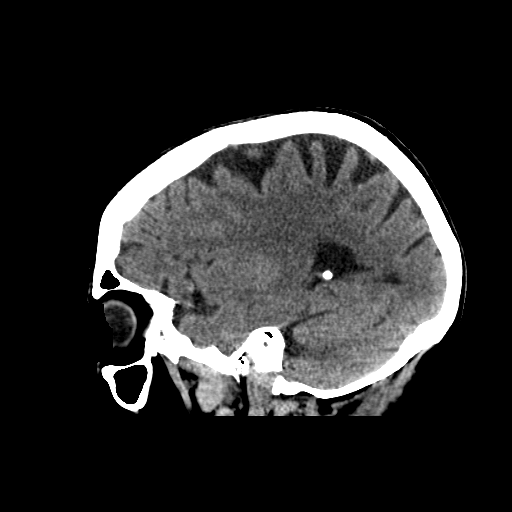

[17 of 47 positions shown; findings below may reference images not displayed]

FINDINGS: Brain: Mild age related volume loss. No acute intracranial
abnormality. Specifically, no hemorrhage, hydrocephalus, mass
lesion, acute infarction, or significant intracranial injury.

Vascular: No hyperdense vessel or unexpected calcification.

Skull: No acute calvarial abnormality.

Sinuses/Orbits: Visualized paranasal sinuses and mastoids clear.
Orbital soft tissues unremarkable.

Other: None
IMPRESSION: No acute intracranial abnormality.

## 2018-03-26 DIAGNOSIS — E039 Hypothyroidism, unspecified: Secondary | ICD-10-CM | POA: Diagnosis not present

## 2018-03-26 DIAGNOSIS — I10 Essential (primary) hypertension: Secondary | ICD-10-CM | POA: Diagnosis not present

## 2018-03-26 DIAGNOSIS — R531 Weakness: Secondary | ICD-10-CM | POA: Diagnosis not present

## 2018-03-26 DIAGNOSIS — N390 Urinary tract infection, site not specified: Secondary | ICD-10-CM | POA: Diagnosis not present

## 2018-04-16 DIAGNOSIS — M542 Cervicalgia: Secondary | ICD-10-CM | POA: Diagnosis not present

## 2018-04-19 ENCOUNTER — Other Ambulatory Visit: Payer: Self-pay | Admitting: Internal Medicine

## 2018-04-19 DIAGNOSIS — M542 Cervicalgia: Secondary | ICD-10-CM

## 2018-04-20 ENCOUNTER — Ambulatory Visit
Admission: RE | Admit: 2018-04-20 | Discharge: 2018-04-20 | Disposition: A | Discharge status: Home / Self Care | Payer: Medicare Other | Source: Ambulatory Visit | Attending: Internal Medicine | Admitting: Internal Medicine

## 2018-04-20 DIAGNOSIS — M542 Cervicalgia: Secondary | ICD-10-CM

## 2018-04-20 DIAGNOSIS — M50321 Other cervical disc degeneration at C4-C5 level: Secondary | ICD-10-CM | POA: Diagnosis not present

## 2018-05-02 DIAGNOSIS — M542 Cervicalgia: Secondary | ICD-10-CM | POA: Diagnosis not present

## 2018-05-18 DIAGNOSIS — Z23 Encounter for immunization: Secondary | ICD-10-CM | POA: Diagnosis not present

## 2018-07-02 DIAGNOSIS — Z0000 Encounter for general adult medical examination without abnormal findings: Secondary | ICD-10-CM | POA: Diagnosis not present

## 2018-07-02 DIAGNOSIS — I10 Essential (primary) hypertension: Secondary | ICD-10-CM | POA: Diagnosis not present

## 2018-07-02 DIAGNOSIS — E039 Hypothyroidism, unspecified: Secondary | ICD-10-CM | POA: Diagnosis not present

## 2018-07-02 DIAGNOSIS — Z780 Asymptomatic menopausal state: Secondary | ICD-10-CM | POA: Diagnosis not present

## 2018-07-02 DIAGNOSIS — R7301 Impaired fasting glucose: Secondary | ICD-10-CM | POA: Diagnosis not present

## 2018-07-02 DIAGNOSIS — M810 Age-related osteoporosis without current pathological fracture: Secondary | ICD-10-CM | POA: Diagnosis not present

## 2018-07-02 DIAGNOSIS — M150 Primary generalized (osteo)arthritis: Secondary | ICD-10-CM | POA: Diagnosis not present

## 2018-07-02 DIAGNOSIS — E782 Mixed hyperlipidemia: Secondary | ICD-10-CM | POA: Diagnosis not present

## 2018-07-09 DIAGNOSIS — M150 Primary generalized (osteo)arthritis: Secondary | ICD-10-CM | POA: Diagnosis not present

## 2018-07-09 DIAGNOSIS — K210 Gastro-esophageal reflux disease with esophagitis: Secondary | ICD-10-CM | POA: Diagnosis not present

## 2018-07-09 DIAGNOSIS — E039 Hypothyroidism, unspecified: Secondary | ICD-10-CM | POA: Diagnosis not present

## 2018-07-09 DIAGNOSIS — E782 Mixed hyperlipidemia: Secondary | ICD-10-CM | POA: Diagnosis not present

## 2018-07-09 DIAGNOSIS — R7301 Impaired fasting glucose: Secondary | ICD-10-CM | POA: Diagnosis not present

## 2018-07-09 DIAGNOSIS — I10 Essential (primary) hypertension: Secondary | ICD-10-CM | POA: Diagnosis not present

## 2018-07-09 DIAGNOSIS — M810 Age-related osteoporosis without current pathological fracture: Secondary | ICD-10-CM | POA: Diagnosis not present

## 2018-07-09 DIAGNOSIS — M349 Systemic sclerosis, unspecified: Secondary | ICD-10-CM | POA: Diagnosis not present

## 2019-02-06 DIAGNOSIS — S46912A Strain of unspecified muscle, fascia and tendon at shoulder and upper arm level, left arm, initial encounter: Secondary | ICD-10-CM | POA: Diagnosis not present

## 2019-02-06 DIAGNOSIS — Z7189 Other specified counseling: Secondary | ICD-10-CM | POA: Diagnosis not present

## 2019-02-07 DIAGNOSIS — E782 Mixed hyperlipidemia: Secondary | ICD-10-CM | POA: Diagnosis not present

## 2019-02-07 DIAGNOSIS — R7301 Impaired fasting glucose: Secondary | ICD-10-CM | POA: Diagnosis not present

## 2019-02-07 DIAGNOSIS — E039 Hypothyroidism, unspecified: Secondary | ICD-10-CM | POA: Diagnosis not present

## 2019-02-14 DIAGNOSIS — I10 Essential (primary) hypertension: Secondary | ICD-10-CM | POA: Diagnosis not present

## 2019-02-14 DIAGNOSIS — Z7189 Other specified counseling: Secondary | ICD-10-CM | POA: Diagnosis not present

## 2019-02-14 DIAGNOSIS — E039 Hypothyroidism, unspecified: Secondary | ICD-10-CM | POA: Diagnosis not present

## 2019-02-14 DIAGNOSIS — R7301 Impaired fasting glucose: Secondary | ICD-10-CM | POA: Diagnosis not present

## 2019-02-14 DIAGNOSIS — E782 Mixed hyperlipidemia: Secondary | ICD-10-CM | POA: Diagnosis not present

## 2019-02-14 DIAGNOSIS — M349 Systemic sclerosis, unspecified: Secondary | ICD-10-CM | POA: Diagnosis not present

## 2019-05-29 DIAGNOSIS — Z23 Encounter for immunization: Secondary | ICD-10-CM | POA: Diagnosis not present

## 2019-07-11 DIAGNOSIS — Z7189 Other specified counseling: Secondary | ICD-10-CM | POA: Diagnosis not present

## 2019-07-11 DIAGNOSIS — R7301 Impaired fasting glucose: Secondary | ICD-10-CM | POA: Diagnosis not present

## 2019-07-11 DIAGNOSIS — E039 Hypothyroidism, unspecified: Secondary | ICD-10-CM | POA: Diagnosis not present

## 2019-07-11 DIAGNOSIS — E782 Mixed hyperlipidemia: Secondary | ICD-10-CM | POA: Diagnosis not present

## 2019-07-11 DIAGNOSIS — I10 Essential (primary) hypertension: Secondary | ICD-10-CM | POA: Diagnosis not present

## 2019-07-11 DIAGNOSIS — M349 Systemic sclerosis, unspecified: Secondary | ICD-10-CM | POA: Diagnosis not present

## 2019-07-11 DIAGNOSIS — Z0000 Encounter for general adult medical examination without abnormal findings: Secondary | ICD-10-CM | POA: Diagnosis not present

## 2019-07-18 DIAGNOSIS — F411 Generalized anxiety disorder: Secondary | ICD-10-CM | POA: Diagnosis not present

## 2019-07-18 DIAGNOSIS — E039 Hypothyroidism, unspecified: Secondary | ICD-10-CM | POA: Diagnosis not present

## 2019-07-18 DIAGNOSIS — I10 Essential (primary) hypertension: Secondary | ICD-10-CM | POA: Diagnosis not present

## 2019-07-18 DIAGNOSIS — Z7189 Other specified counseling: Secondary | ICD-10-CM | POA: Diagnosis not present

## 2019-07-18 DIAGNOSIS — E782 Mixed hyperlipidemia: Secondary | ICD-10-CM | POA: Diagnosis not present

## 2019-07-18 DIAGNOSIS — R7301 Impaired fasting glucose: Secondary | ICD-10-CM | POA: Diagnosis not present

## 2019-07-18 DIAGNOSIS — M349 Systemic sclerosis, unspecified: Secondary | ICD-10-CM | POA: Diagnosis not present

## 2019-07-18 DIAGNOSIS — M810 Age-related osteoporosis without current pathological fracture: Secondary | ICD-10-CM | POA: Diagnosis not present

## 2019-07-18 DIAGNOSIS — Z20828 Contact with and (suspected) exposure to other viral communicable diseases: Secondary | ICD-10-CM | POA: Diagnosis not present

## 2019-07-18 DIAGNOSIS — M150 Primary generalized (osteo)arthritis: Secondary | ICD-10-CM | POA: Diagnosis not present

## 2019-09-19 ENCOUNTER — Ambulatory Visit: Payer: Medicare Other | Attending: Internal Medicine

## 2019-09-19 DIAGNOSIS — Z23 Encounter for immunization: Secondary | ICD-10-CM | POA: Diagnosis not present

## 2019-09-19 NOTE — Progress Notes (Signed)
   Covid-19 Vaccination Clinic  Name:  JAMESON MORROW    MRN: [id number] DOB: [birth year]  09/19/2019  Ms. Kapuscinski was observed post Covid-19 immunization for 15 minutes without incidence. She was provided with Vaccine Information Sheet and instruction to access the V-Safe system.   Ms. Lacour was instructed to call [phone] with any severe reactions post vaccine: Marland Kitchen Difficulty breathing  . Swelling of your face and throat  . A fast heartbeat  . A bad rash all over your body  . Dizziness and weakness    Immunizations Administered    Name Date Dose VIS Date Route   Pfizer COVID-19 Vaccine 09/19/2019 10:12 AM 0.3 mL 08/16/2019 Intramuscular   Manufacturer: [employer], [employer]   Lot: [id number]   NDC: [address]-0

## 2019-10-07 ENCOUNTER — Ambulatory Visit: Payer: Medicare Other

## 2019-10-07 ENCOUNTER — Ambulatory Visit: Payer: Medicare Other | Attending: Internal Medicine

## 2019-10-07 DIAGNOSIS — Z23 Encounter for immunization: Secondary | ICD-10-CM | POA: Insufficient documentation

## 2019-10-07 NOTE — Progress Notes (Signed)
   Cov[facility] Vaccination [facility]  Name:  Andrea Neal    MRN: [id number] DOB: [birth year]  10/07/2019  [name] was observed post Covid-19 immunization for 15 minutes without incidence. She was provided with Vaccine Information Sheet and instruction to access the V-Safe system.   [name] was instructed to call 911 with any severe reactions post vaccine: Marland Kitchen Difficulty breathing  . Swelling of your face and throat  . A fast heartbeat  . A bad rash all over your body  . Dizziness and weakness    Immunizations Administered    Name Date Dose VIS Date Route   Pfizer COVID-19 Vaccine 10/07/2019 11:15 AM 0.3 mL 08/16/2019 Intramuscular   Manufacturer: [employer], [employer]   Lot: [id number]   NDC: [address]-9

## 2019-10-28 DIAGNOSIS — S139XXA Sprain of joints and ligaments of unspecified parts of neck, initial encounter: Secondary | ICD-10-CM | POA: Diagnosis not present

## 2020-01-16 DIAGNOSIS — E782 Mixed hyperlipidemia: Secondary | ICD-10-CM | POA: Diagnosis not present

## 2020-01-16 DIAGNOSIS — E039 Hypothyroidism, unspecified: Secondary | ICD-10-CM | POA: Diagnosis not present

## 2020-01-16 DIAGNOSIS — M150 Primary generalized (osteo)arthritis: Secondary | ICD-10-CM | POA: Diagnosis not present

## 2020-01-16 DIAGNOSIS — R7301 Impaired fasting glucose: Secondary | ICD-10-CM | POA: Diagnosis not present

## 2020-01-28 DIAGNOSIS — M150 Primary generalized (osteo)arthritis: Secondary | ICD-10-CM | POA: Diagnosis not present

## 2020-01-28 DIAGNOSIS — E039 Hypothyroidism, unspecified: Secondary | ICD-10-CM | POA: Diagnosis not present

## 2020-01-28 DIAGNOSIS — I10 Essential (primary) hypertension: Secondary | ICD-10-CM | POA: Diagnosis not present

## 2020-01-28 DIAGNOSIS — E782 Mixed hyperlipidemia: Secondary | ICD-10-CM | POA: Diagnosis not present

## 2020-01-28 DIAGNOSIS — R7301 Impaired fasting glucose: Secondary | ICD-10-CM | POA: Diagnosis not present

## 2020-01-28 DIAGNOSIS — M349 Systemic sclerosis, unspecified: Secondary | ICD-10-CM | POA: Diagnosis not present

## 2020-01-28 DIAGNOSIS — F411 Generalized anxiety disorder: Secondary | ICD-10-CM | POA: Diagnosis not present

## 2020-02-12 DIAGNOSIS — H43813 Vitreous degeneration, bilateral: Secondary | ICD-10-CM | POA: Diagnosis not present

## 2020-02-12 DIAGNOSIS — H26493 Other secondary cataract, bilateral: Secondary | ICD-10-CM | POA: Diagnosis not present

## 2020-02-12 DIAGNOSIS — H353131 Nonexudative age-related macular degeneration, bilateral, early dry stage: Secondary | ICD-10-CM | POA: Diagnosis not present

## 2020-02-12 DIAGNOSIS — H35033 Hypertensive retinopathy, bilateral: Secondary | ICD-10-CM | POA: Diagnosis not present

## 2020-03-31 DIAGNOSIS — H9202 Otalgia, left ear: Secondary | ICD-10-CM | POA: Diagnosis not present

## 2020-03-31 DIAGNOSIS — H6120 Impacted cerumen, unspecified ear: Secondary | ICD-10-CM | POA: Diagnosis not present

## 2020-03-31 DIAGNOSIS — H6982 Other specified disorders of Eustachian tube, left ear: Secondary | ICD-10-CM | POA: Diagnosis not present

## 2020-06-22 DIAGNOSIS — Z23 Encounter for immunization: Secondary | ICD-10-CM | POA: Diagnosis not present

## 2020-08-03 DIAGNOSIS — K219 Gastro-esophageal reflux disease without esophagitis: Secondary | ICD-10-CM | POA: Diagnosis not present

## 2020-08-03 DIAGNOSIS — E039 Hypothyroidism, unspecified: Secondary | ICD-10-CM | POA: Diagnosis not present

## 2020-08-03 DIAGNOSIS — I10 Essential (primary) hypertension: Secondary | ICD-10-CM | POA: Diagnosis not present

## 2020-08-03 DIAGNOSIS — Z6824 Body mass index (BMI) 24.0-24.9, adult: Secondary | ICD-10-CM | POA: Diagnosis not present

## 2021-01-11 DIAGNOSIS — K219 Gastro-esophageal reflux disease without esophagitis: Secondary | ICD-10-CM | POA: Diagnosis not present

## 2021-01-11 DIAGNOSIS — Z6824 Body mass index (BMI) 24.0-24.9, adult: Secondary | ICD-10-CM | POA: Diagnosis not present

## 2021-01-11 DIAGNOSIS — E039 Hypothyroidism, unspecified: Secondary | ICD-10-CM | POA: Diagnosis not present

## 2021-01-11 DIAGNOSIS — I10 Essential (primary) hypertension: Secondary | ICD-10-CM | POA: Diagnosis not present

## 2021-01-11 DIAGNOSIS — Z0000 Encounter for general adult medical examination without abnormal findings: Secondary | ICD-10-CM | POA: Diagnosis not present

## 2021-02-23 DIAGNOSIS — M349 Systemic sclerosis, unspecified: Secondary | ICD-10-CM | POA: Diagnosis not present

## 2021-02-23 DIAGNOSIS — Z6826 Body mass index (BMI) 26.0-26.9, adult: Secondary | ICD-10-CM | POA: Diagnosis not present

## 2021-02-23 DIAGNOSIS — E039 Hypothyroidism, unspecified: Secondary | ICD-10-CM | POA: Diagnosis not present

## 2021-02-23 DIAGNOSIS — I10 Essential (primary) hypertension: Secondary | ICD-10-CM | POA: Diagnosis not present

## 2021-02-23 DIAGNOSIS — M129 Arthropathy, unspecified: Secondary | ICD-10-CM | POA: Diagnosis not present

## 2021-02-23 DIAGNOSIS — Z0000 Encounter for general adult medical examination without abnormal findings: Secondary | ICD-10-CM | POA: Diagnosis not present

## 2021-02-23 DIAGNOSIS — K219 Gastro-esophageal reflux disease without esophagitis: Secondary | ICD-10-CM | POA: Diagnosis not present

## 2021-03-16 DIAGNOSIS — M19042 Primary osteoarthritis, left hand: Secondary | ICD-10-CM | POA: Diagnosis not present

## 2021-03-16 DIAGNOSIS — M19041 Primary osteoarthritis, right hand: Secondary | ICD-10-CM | POA: Diagnosis not present

## 2021-03-16 DIAGNOSIS — M170 Bilateral primary osteoarthritis of knee: Secondary | ICD-10-CM | POA: Diagnosis not present

## 2021-03-16 DIAGNOSIS — M65341 Trigger finger, right ring finger: Secondary | ICD-10-CM | POA: Diagnosis not present

## 2021-04-06 DIAGNOSIS — Z6825 Body mass index (BMI) 25.0-25.9, adult: Secondary | ICD-10-CM | POA: Diagnosis not present

## 2021-04-06 DIAGNOSIS — I10 Essential (primary) hypertension: Secondary | ICD-10-CM | POA: Diagnosis not present
